# Patient Record
Sex: Male | Born: 1970 | Race: White | Hispanic: No | Marital: Married | Smoking: Former smoker
Health system: Southern US, Community
[De-identification: ages and names within clinical notes are randomized; demographics above are authoritative.]

## PROBLEM LIST (undated history)

## (undated) ENCOUNTER — Inpatient Hospital Stay: Admission: EM | Payer: Self-pay | Source: Home / Self Care

## (undated) DIAGNOSIS — Z923 Personal history of irradiation: Secondary | ICD-10-CM

## (undated) DIAGNOSIS — Z9221 Personal history of antineoplastic chemotherapy: Secondary | ICD-10-CM

## (undated) DIAGNOSIS — F419 Anxiety disorder, unspecified: Secondary | ICD-10-CM

## (undated) DIAGNOSIS — C801 Malignant (primary) neoplasm, unspecified: Secondary | ICD-10-CM

## (undated) DIAGNOSIS — F319 Bipolar disorder, unspecified: Secondary | ICD-10-CM

## (undated) DIAGNOSIS — C099 Malignant neoplasm of tonsil, unspecified: Secondary | ICD-10-CM

## (undated) HISTORY — DX: Personal history of irradiation: Z92.3

## (undated) HISTORY — DX: Malignant neoplasm of tonsil, unspecified: C09.9

## (undated) HISTORY — DX: Anxiety disorder, unspecified: F41.9

## (undated) HISTORY — DX: Bipolar disorder, unspecified: F31.9

## (undated) HISTORY — DX: Personal history of antineoplastic chemotherapy: Z92.21

---

## 1999-11-15 ENCOUNTER — Ambulatory Visit (HOSPITAL_COMMUNITY): Admission: RE | Admit: 1999-11-15 | Discharge: 1999-11-15 | Payer: Self-pay | Admitting: Urology

## 1999-11-15 ENCOUNTER — Encounter (INDEPENDENT_AMBULATORY_CARE_PROVIDER_SITE_OTHER): Payer: Self-pay

## 2006-07-18 HISTORY — PX: KNEE SURGERY: SHX244

## 2007-01-28 ENCOUNTER — Encounter: Admission: RE | Admit: 2007-01-28 | Discharge: 2007-01-28 | Payer: Self-pay | Admitting: Family Medicine

## 2007-11-15 HISTORY — PX: OTHER SURGICAL HISTORY: SHX169

## 2008-05-27 ENCOUNTER — Other Ambulatory Visit: Admission: RE | Admit: 2008-05-27 | Discharge: 2008-05-27 | Payer: Self-pay | Admitting: Otolaryngology

## 2008-06-03 ENCOUNTER — Encounter: Admission: RE | Admit: 2008-06-03 | Discharge: 2008-06-03 | Payer: Self-pay | Admitting: Otolaryngology

## 2008-07-04 DIAGNOSIS — C801 Malignant (primary) neoplasm, unspecified: Secondary | ICD-10-CM

## 2008-07-04 HISTORY — DX: Malignant (primary) neoplasm, unspecified: C80.1

## 2008-07-08 ENCOUNTER — Ambulatory Visit (HOSPITAL_COMMUNITY): Admission: RE | Admit: 2008-07-08 | Discharge: 2008-07-08 | Payer: Self-pay | Admitting: Otolaryngology

## 2008-07-09 ENCOUNTER — Ambulatory Visit: Admission: RE | Admit: 2008-07-09 | Discharge: 2008-10-07 | Payer: Self-pay | Admitting: Radiation Oncology

## 2008-07-16 ENCOUNTER — Ambulatory Visit: Payer: Self-pay | Admitting: Internal Medicine

## 2008-07-18 DIAGNOSIS — Z9221 Personal history of antineoplastic chemotherapy: Secondary | ICD-10-CM

## 2008-07-18 HISTORY — DX: Personal history of antineoplastic chemotherapy: Z92.21

## 2008-07-22 ENCOUNTER — Ambulatory Visit: Payer: Self-pay | Admitting: Dentistry

## 2008-07-22 ENCOUNTER — Encounter: Admission: AD | Admit: 2008-07-22 | Discharge: 2008-07-22 | Payer: Self-pay | Admitting: Dentistry

## 2008-07-23 ENCOUNTER — Encounter (INDEPENDENT_AMBULATORY_CARE_PROVIDER_SITE_OTHER): Payer: Self-pay | Admitting: Otolaryngology

## 2008-07-23 ENCOUNTER — Ambulatory Visit (HOSPITAL_COMMUNITY): Admission: RE | Admit: 2008-07-23 | Discharge: 2008-07-23 | Payer: Self-pay | Admitting: Otolaryngology

## 2008-07-28 LAB — CBC WITH DIFFERENTIAL/PLATELET
BASO%: 0.2 % (ref 0.0–2.0)
Basophils Absolute: 0 10*3/uL (ref 0.0–0.1)
EOS%: 0.6 % (ref 0.0–7.0)
MCH: 30 pg (ref 28.0–33.4)
MCHC: 34.3 g/dL (ref 32.0–35.9)
MCV: 87.2 fL (ref 81.6–98.0)
MONO%: 6.5 % (ref 0.0–13.0)
NEUT%: 69.6 % (ref 40.0–75.0)
RDW: 13.3 % (ref 11.2–14.6)
lymph#: 1.7 10*3/uL (ref 0.9–3.3)

## 2008-07-28 LAB — COMPREHENSIVE METABOLIC PANEL
ALT: 16 U/L (ref 0–53)
AST: 14 U/L (ref 0–37)
Alkaline Phosphatase: 70 U/L (ref 39–117)
BUN: 14 mg/dL (ref 6–23)
Calcium: 10.1 mg/dL (ref 8.4–10.5)
Chloride: 105 mEq/L (ref 96–112)
Creatinine, Ser: 1.03 mg/dL (ref 0.40–1.50)
Potassium: 4.9 mEq/L (ref 3.5–5.3)

## 2008-08-01 ENCOUNTER — Ambulatory Visit (HOSPITAL_COMMUNITY): Admission: RE | Admit: 2008-08-01 | Discharge: 2008-08-01 | Payer: Self-pay | Admitting: Radiation Oncology

## 2008-08-18 LAB — COMPREHENSIVE METABOLIC PANEL
ALT: 27 U/L (ref 0–53)
CO2: 27 mEq/L (ref 19–32)
Creatinine, Ser: 0.8 mg/dL (ref 0.40–1.50)
Glucose, Bld: 131 mg/dL — ABNORMAL HIGH (ref 70–99)
Total Bilirubin: 0.6 mg/dL (ref 0.3–1.2)

## 2008-08-18 LAB — CBC WITH DIFFERENTIAL/PLATELET
BASO%: 0.2 % (ref 0.0–2.0)
HCT: 38.8 % (ref 38.7–49.9)
LYMPH%: 17.5 % (ref 14.0–48.0)
MCHC: 34.6 g/dL (ref 32.0–35.9)
MCV: 86 fL (ref 81.6–98.0)
MONO#: 0.5 10*3/uL (ref 0.1–0.9)
MONO%: 5.4 % (ref 0.0–13.0)
NEUT%: 75.5 % — ABNORMAL HIGH (ref 40.0–75.0)
Platelets: 336 10*3/uL (ref 145–400)
WBC: 8.9 10*3/uL (ref 4.0–10.0)

## 2008-08-18 LAB — MAGNESIUM: Magnesium: 2.7 mg/dL — ABNORMAL HIGH (ref 1.5–2.5)

## 2008-08-25 LAB — COMPREHENSIVE METABOLIC PANEL
ALT: 14 U/L (ref 0–53)
BUN: 14 mg/dL (ref 6–23)
CO2: 21 mEq/L (ref 19–32)
Calcium: 9.2 mg/dL (ref 8.4–10.5)
Chloride: 110 mEq/L (ref 96–112)
Creatinine, Ser: 0.76 mg/dL (ref 0.40–1.50)

## 2008-08-25 LAB — CBC WITH DIFFERENTIAL/PLATELET
Basophils Absolute: 0 10*3/uL (ref 0.0–0.1)
Eosinophils Absolute: 0.1 10*3/uL (ref 0.0–0.5)
HCT: 35.2 % — ABNORMAL LOW (ref 38.7–49.9)
HGB: 12.1 g/dL — ABNORMAL LOW (ref 13.0–17.1)
MONO#: 0.4 10*3/uL (ref 0.1–0.9)
NEUT%: 76.5 % — ABNORMAL HIGH (ref 40.0–75.0)
WBC: 7.4 10*3/uL (ref 4.0–10.0)
lymph#: 1.2 10*3/uL (ref 0.9–3.3)

## 2008-09-01 ENCOUNTER — Ambulatory Visit: Payer: Self-pay | Admitting: Internal Medicine

## 2008-09-01 LAB — CBC WITH DIFFERENTIAL/PLATELET
BASO%: 0.1 % (ref 0.0–2.0)
Eosinophils Absolute: 0 10*3/uL (ref 0.0–0.5)
MCV: 86.1 fL (ref 81.6–98.0)
MONO#: 0.2 10*3/uL (ref 0.1–0.9)
MONO%: 4 % (ref 0.0–13.0)
NEUT#: 5.2 10*3/uL (ref 1.5–6.5)
RBC: 3.67 10*6/uL — ABNORMAL LOW (ref 4.20–5.71)
RDW: 13.2 % (ref 11.2–14.6)
WBC: 6.1 10*3/uL (ref 4.0–10.0)

## 2008-09-01 LAB — COMPREHENSIVE METABOLIC PANEL
ALT: 16 U/L (ref 0–53)
Albumin: 4.2 g/dL (ref 3.5–5.2)
Alkaline Phosphatase: 75 U/L (ref 39–117)
Glucose, Bld: 87 mg/dL (ref 70–99)
Potassium: 4.3 mEq/L (ref 3.5–5.3)
Sodium: 142 mEq/L (ref 135–145)
Total Protein: 6.3 g/dL (ref 6.0–8.3)

## 2008-09-08 LAB — CBC WITH DIFFERENTIAL/PLATELET
BASO%: 0 % (ref 0.0–2.0)
EOS%: 0.2 % (ref 0.0–7.0)
LYMPH%: 5.1 % — ABNORMAL LOW (ref 14.0–49.0)
MCH: 29.7 pg (ref 27.2–33.4)
MCHC: 34.7 g/dL (ref 32.0–36.0)
MCV: 85.5 fL (ref 79.3–98.0)
MONO#: 0.5 10*3/uL (ref 0.1–0.9)
MONO%: 8.5 % (ref 0.0–14.0)
Platelets: 208 10*3/uL (ref 140–400)
RBC: 3.42 10*6/uL — ABNORMAL LOW (ref 4.20–5.82)
WBC: 6.1 10*3/uL (ref 4.0–10.3)

## 2008-09-08 LAB — COMPREHENSIVE METABOLIC PANEL
ALT: 13 U/L (ref 0–53)
AST: 10 U/L (ref 0–37)
Albumin: 4.2 g/dL (ref 3.5–5.2)
CO2: 27 mEq/L (ref 19–32)
Calcium: 9.2 mg/dL (ref 8.4–10.5)
Chloride: 99 mEq/L (ref 96–112)
Potassium: 4.4 mEq/L (ref 3.5–5.3)
Sodium: 137 mEq/L (ref 135–145)
Total Protein: 6.6 g/dL (ref 6.0–8.3)

## 2008-09-08 LAB — MAGNESIUM: Magnesium: 2.2 mg/dL (ref 1.5–2.5)

## 2008-09-15 LAB — CBC WITH DIFFERENTIAL/PLATELET
BASO%: 0.2 % (ref 0.0–2.0)
EOS%: 0.2 % (ref 0.0–7.0)
MCH: 28.7 pg (ref 27.2–33.4)
MCHC: 33.2 g/dL (ref 32.0–36.0)
MCV: 86.3 fL (ref 79.3–98.0)
MONO%: 10 % (ref 0.0–14.0)
RBC: 3.28 10*6/uL — ABNORMAL LOW (ref 4.20–5.82)
RDW: 13.5 % (ref 11.0–14.6)
lymph#: 0.3 10*3/uL — ABNORMAL LOW (ref 0.9–3.3)

## 2008-09-16 ENCOUNTER — Ambulatory Visit: Payer: Self-pay | Admitting: Dentistry

## 2008-09-22 LAB — COMPREHENSIVE METABOLIC PANEL
Albumin: 4 g/dL (ref 3.5–5.2)
Alkaline Phosphatase: 83 U/L (ref 39–117)
Chloride: 102 mEq/L (ref 96–112)
Glucose, Bld: 134 mg/dL — ABNORMAL HIGH (ref 70–99)
Potassium: 4.1 mEq/L (ref 3.5–5.3)
Sodium: 139 mEq/L (ref 135–145)
Total Protein: 6 g/dL (ref 6.0–8.3)

## 2008-09-22 LAB — CBC WITH DIFFERENTIAL/PLATELET
BASO%: 0.5 % (ref 0.0–2.0)
LYMPH%: 7.5 % — ABNORMAL LOW (ref 14.0–49.0)
MCHC: 33.1 g/dL (ref 32.0–36.0)
MCV: 87 fL (ref 79.3–98.0)
MONO#: 0.3 10*3/uL (ref 0.1–0.9)
MONO%: 8.2 % (ref 0.0–14.0)
Platelets: 200 10*3/uL (ref 140–400)
RBC: 3.47 10*6/uL — ABNORMAL LOW (ref 4.20–5.82)
WBC: 3.9 10*3/uL — ABNORMAL LOW (ref 4.0–10.3)
nRBC: 0 % (ref 0–0)

## 2008-09-29 LAB — CBC WITH DIFFERENTIAL/PLATELET
BASO%: 0.3 % (ref 0.0–2.0)
EOS%: 0.3 % (ref 0.0–7.0)
MCH: 28.7 pg (ref 27.2–33.4)
MCHC: 32.6 g/dL (ref 32.0–36.0)
RBC: 3.14 10*6/uL — ABNORMAL LOW (ref 4.20–5.82)
RDW: 14.7 % — ABNORMAL HIGH (ref 11.0–14.6)
lymph#: 0.3 10*3/uL — ABNORMAL LOW (ref 0.9–3.3)

## 2008-09-29 LAB — COMPREHENSIVE METABOLIC PANEL
AST: 8 U/L (ref 0–37)
Albumin: 3.9 g/dL (ref 3.5–5.2)
Alkaline Phosphatase: 79 U/L (ref 39–117)
BUN: 20 mg/dL (ref 6–23)
CO2: 26 mEq/L (ref 19–32)
Glucose, Bld: 121 mg/dL — ABNORMAL HIGH (ref 70–99)
Potassium: 4.3 mEq/L (ref 3.5–5.3)
Sodium: 138 mEq/L (ref 135–145)
Total Protein: 5.9 g/dL — ABNORMAL LOW (ref 6.0–8.3)

## 2008-10-08 ENCOUNTER — Ambulatory Visit: Admission: RE | Admit: 2008-10-08 | Discharge: 2008-11-07 | Payer: Self-pay | Admitting: Radiation Oncology

## 2008-11-10 ENCOUNTER — Ambulatory Visit: Payer: Self-pay | Admitting: Dentistry

## 2008-11-12 ENCOUNTER — Ambulatory Visit (HOSPITAL_COMMUNITY): Admission: RE | Admit: 2008-11-12 | Discharge: 2008-11-12 | Payer: Self-pay | Admitting: Radiation Oncology

## 2008-12-02 ENCOUNTER — Ambulatory Visit (HOSPITAL_COMMUNITY): Admission: RE | Admit: 2008-12-02 | Discharge: 2008-12-02 | Payer: Self-pay | Admitting: Radiation Oncology

## 2009-01-12 ENCOUNTER — Ambulatory Visit (HOSPITAL_COMMUNITY): Admission: RE | Admit: 2009-01-12 | Discharge: 2009-01-12 | Payer: Self-pay | Admitting: Internal Medicine

## 2009-01-13 ENCOUNTER — Ambulatory Visit: Payer: Self-pay | Admitting: Internal Medicine

## 2009-01-15 LAB — COMPREHENSIVE METABOLIC PANEL
ALT: 9 U/L (ref 0–53)
AST: 9 U/L (ref 0–37)
Albumin: 4.8 g/dL (ref 3.5–5.2)
BUN: 10 mg/dL (ref 6–23)
Calcium: 10 mg/dL (ref 8.4–10.5)
Chloride: 106 mEq/L (ref 96–112)
Potassium: 4.5 mEq/L (ref 3.5–5.3)
Sodium: 140 mEq/L (ref 135–145)
Total Protein: 6.9 g/dL (ref 6.0–8.3)

## 2009-01-15 LAB — CBC WITH DIFFERENTIAL/PLATELET
Basophils Absolute: 0 10*3/uL (ref 0.0–0.1)
EOS%: 1.7 % (ref 0.0–7.0)
HGB: 14.1 g/dL (ref 13.0–17.1)
MCH: 31.4 pg (ref 27.2–33.4)
NEUT#: 1.9 10*3/uL (ref 1.5–6.5)
RBC: 4.49 10*6/uL (ref 4.20–5.82)
RDW: 12.4 % (ref 11.0–14.6)
lymph#: 0.4 10*3/uL — ABNORMAL LOW (ref 0.9–3.3)

## 2009-04-17 ENCOUNTER — Ambulatory Visit: Payer: Self-pay | Admitting: Internal Medicine

## 2009-04-28 ENCOUNTER — Ambulatory Visit (HOSPITAL_COMMUNITY): Admission: RE | Admit: 2009-04-28 | Discharge: 2009-04-28 | Payer: Self-pay | Admitting: Internal Medicine

## 2009-10-30 ENCOUNTER — Ambulatory Visit: Payer: Self-pay | Admitting: Internal Medicine

## 2009-11-03 ENCOUNTER — Ambulatory Visit (HOSPITAL_COMMUNITY): Admission: RE | Admit: 2009-11-03 | Discharge: 2009-11-03 | Payer: Self-pay | Admitting: Internal Medicine

## 2009-11-06 LAB — CBC WITH DIFFERENTIAL/PLATELET
Basophils Absolute: 0 10*3/uL (ref 0.0–0.1)
Eosinophils Absolute: 0.1 10*3/uL (ref 0.0–0.5)
HGB: 12.7 g/dL — ABNORMAL LOW (ref 13.0–17.1)
MCV: 89.1 fL (ref 79.3–98.0)
MONO#: 0.4 10*3/uL (ref 0.1–0.9)
MONO%: 10.6 % (ref 0.0–14.0)
NEUT#: 3.1 10*3/uL (ref 1.5–6.5)
RBC: 4.2 10*6/uL (ref 4.20–5.82)
RDW: 12.7 % (ref 11.0–14.6)
WBC: 4 10*3/uL (ref 4.0–10.3)
lymph#: 0.4 10*3/uL — ABNORMAL LOW (ref 0.9–3.3)

## 2009-11-06 LAB — COMPREHENSIVE METABOLIC PANEL
Albumin: 4.4 g/dL (ref 3.5–5.2)
Alkaline Phosphatase: 72 U/L (ref 39–117)
Calcium: 9.4 mg/dL (ref 8.4–10.5)
Chloride: 105 mEq/L (ref 96–112)
Glucose, Bld: 102 mg/dL — ABNORMAL HIGH (ref 70–99)
Potassium: 4 mEq/L (ref 3.5–5.3)
Sodium: 143 mEq/L (ref 135–145)
Total Protein: 6.3 g/dL (ref 6.0–8.3)

## 2010-05-04 ENCOUNTER — Ambulatory Visit: Payer: Self-pay | Admitting: Internal Medicine

## 2010-05-05 ENCOUNTER — Ambulatory Visit (HOSPITAL_COMMUNITY): Admission: RE | Admit: 2010-05-05 | Discharge: 2010-05-05 | Payer: Self-pay | Admitting: Internal Medicine

## 2010-05-05 LAB — CBC WITH DIFFERENTIAL/PLATELET
Basophils Absolute: 0 10*3/uL (ref 0.0–0.1)
Eosinophils Absolute: 0.1 10*3/uL (ref 0.0–0.5)
HGB: 13.5 g/dL (ref 13.0–17.1)
MCV: 87.2 fL (ref 79.3–98.0)
MONO#: 0.3 10*3/uL (ref 0.1–0.9)
MONO%: 12.4 % (ref 0.0–14.0)
NEUT#: 1.8 10*3/uL (ref 1.5–6.5)
Platelets: 184 10*3/uL (ref 140–400)
RBC: 4.38 10*6/uL (ref 4.20–5.82)
RDW: 12.5 % (ref 11.0–14.6)
WBC: 2.6 10*3/uL — ABNORMAL LOW (ref 4.0–10.3)

## 2010-05-05 LAB — COMPREHENSIVE METABOLIC PANEL
Albumin: 4.3 g/dL (ref 3.5–5.2)
BUN: 14 mg/dL (ref 6–23)
CO2: 25 mEq/L (ref 19–32)
Calcium: 9.2 mg/dL (ref 8.4–10.5)
Glucose, Bld: 92 mg/dL (ref 70–99)
Potassium: 4.2 mEq/L (ref 3.5–5.3)
Sodium: 134 mEq/L — ABNORMAL LOW (ref 135–145)
Total Protein: 6 g/dL (ref 6.0–8.3)

## 2010-08-06 ENCOUNTER — Other Ambulatory Visit: Payer: Self-pay | Admitting: Internal Medicine

## 2010-08-06 DIAGNOSIS — C801 Malignant (primary) neoplasm, unspecified: Secondary | ICD-10-CM

## 2010-08-08 ENCOUNTER — Encounter: Payer: Self-pay | Admitting: Internal Medicine

## 2010-08-12 LAB — CBC WITH DIFFERENTIAL/PLATELET
BASO%: 0.5 % (ref 0.0–2.0)
Basophils Absolute: 0 10*3/uL (ref 0.0–0.1)
EOS%: 2.7 % (ref 0.0–7.0)
HCT: 39.4 % (ref 38.4–49.9)
HGB: 13.5 g/dL (ref 13.0–17.1)
MONO#: 0.4 10*3/uL (ref 0.1–0.9)
NEUT%: 72.8 % (ref 39.0–75.0)
RDW: 12.7 % (ref 11.0–14.6)
WBC: 4.4 10*3/uL (ref 4.0–10.3)
lymph#: 0.7 10*3/uL — ABNORMAL LOW (ref 0.9–3.3)

## 2010-10-05 LAB — GLUCOSE, CAPILLARY: Glucose-Capillary: 94 mg/dL (ref 70–99)

## 2010-11-01 LAB — BASIC METABOLIC PANEL
CO2: 26 mEq/L (ref 19–32)
Calcium: 9.4 mg/dL (ref 8.4–10.5)
Creatinine, Ser: 0.94 mg/dL (ref 0.4–1.5)
GFR calc Af Amer: >60 mL/min (ref >60)
Glucose, Bld: 104 mg/dL — ABNORMAL HIGH (ref 70–99)

## 2010-11-01 LAB — CBC
Hemoglobin: 11.7 g/dL — ABNORMAL LOW (ref 13.0–17.0)
MCHC: 33.5 g/dL (ref 30.0–36.0)
MCHC: 33.8 g/dL (ref 30.0–36.0)
MCV: 88.2 fL (ref 78.0–100.0)
RBC: 3.94 MIL/uL — ABNORMAL LOW (ref 4.22–5.81)
RDW: 13.2 % (ref 11.5–15.5)
RDW: 12.8 % (ref 11.5–15.5)

## 2010-11-12 ENCOUNTER — Other Ambulatory Visit (HOSPITAL_COMMUNITY): Payer: Self-pay

## 2010-11-17 ENCOUNTER — Inpatient Hospital Stay (HOSPITAL_COMMUNITY): Admission: RE | Admit: 2010-11-17 | Payer: Self-pay | Source: Ambulatory Visit

## 2010-11-18 ENCOUNTER — Other Ambulatory Visit: Payer: Self-pay | Admitting: Internal Medicine

## 2010-11-18 ENCOUNTER — Encounter (HOSPITAL_COMMUNITY): Payer: Self-pay

## 2010-11-18 ENCOUNTER — Ambulatory Visit (HOSPITAL_COMMUNITY)
Admission: RE | Admit: 2010-11-18 | Discharge: 2010-11-18 | Disposition: A | Discharge status: Home / Self Care | Payer: BC Managed Care – PPO | Source: Ambulatory Visit | Attending: Internal Medicine | Admitting: Internal Medicine

## 2010-11-18 DIAGNOSIS — C801 Malignant (primary) neoplasm, unspecified: Secondary | ICD-10-CM

## 2010-11-18 DIAGNOSIS — E278 Other specified disorders of adrenal gland: Secondary | ICD-10-CM | POA: Insufficient documentation

## 2010-11-18 DIAGNOSIS — C119 Malignant neoplasm of nasopharynx, unspecified: Secondary | ICD-10-CM | POA: Insufficient documentation

## 2010-11-18 DIAGNOSIS — J329 Chronic sinusitis, unspecified: Secondary | ICD-10-CM | POA: Insufficient documentation

## 2010-11-18 HISTORY — DX: Malignant (primary) neoplasm, unspecified: C80.1

## 2010-11-18 MED ORDER — IOHEXOL 300 MG/ML  SOLN
100.0000 mL | Freq: Once | INTRAMUSCULAR | Status: AC | PRN
  Administered 2010-11-18: 100 mL via INTRAVENOUS

## 2010-11-30 ENCOUNTER — Encounter (HOSPITAL_BASED_OUTPATIENT_CLINIC_OR_DEPARTMENT_OTHER): Payer: BC Managed Care – PPO | Admitting: Internal Medicine

## 2010-11-30 ENCOUNTER — Other Ambulatory Visit: Payer: Self-pay | Admitting: Internal Medicine

## 2010-11-30 DIAGNOSIS — C119 Malignant neoplasm of nasopharynx, unspecified: Secondary | ICD-10-CM

## 2010-11-30 DIAGNOSIS — C111 Malignant neoplasm of posterior wall of nasopharynx: Secondary | ICD-10-CM

## 2010-11-30 LAB — COMPREHENSIVE METABOLIC PANEL
ALT: 15 U/L (ref 0–53)
BUN: 15 mg/dL (ref 6–23)
CO2: 28 mEq/L (ref 19–32)
Calcium: 9.4 mg/dL (ref 8.4–10.5)
Chloride: 104 mEq/L (ref 96–112)
Creatinine, Ser: 0.95 mg/dL (ref 0.40–1.50)

## 2010-11-30 LAB — CBC WITH DIFFERENTIAL/PLATELET
Eosinophils Absolute: 0.1 10*3/uL (ref 0.0–0.5)
MONO#: 0.4 10*3/uL (ref 0.1–0.9)
NEUT#: 2.8 10*3/uL (ref 1.5–6.5)
RBC: 4.57 10*6/uL (ref 4.20–5.82)
RDW: 13.3 % (ref 11.0–14.6)
WBC: 3.9 10*3/uL — ABNORMAL LOW (ref 4.0–10.3)
lymph#: 0.6 10*3/uL — ABNORMAL LOW (ref 0.9–3.3)

## 2010-11-30 LAB — LACTATE DEHYDROGENASE: LDH: 169 U/L (ref 94–250)

## 2010-11-30 NOTE — Op Note (Signed)
NAMEANMOL, FLECK            ACCOUNT NO.:  [id number]   MEDICAL RECORD NO.:  [id number]          PATIENT TYPE:  AMB   LOCATION:  SDS                          FACILITY:  [facility]   PHYSICIAN:  Jefry H. 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   POSTOPERATIVE DIAGNOSIS:  Squamous cell carcinoma of nasal cavity.   SURGEON:  Jefry H. Pollyann Kennedy, MD   General endotracheal anesthesia was used.   PROCEDURE:  Nasal endoscopy with biopsy of nasal mass.   FINDINGS:  Exophytic mass involving bilateral posterior nasal cavities  more so on the left, that seemed to be arising from the floor of the  nose and the septum.  Severe deviation of the nasal septum prevents easy  access and visibility of both sides of the tumor.  There is some  fullness of the hard and soft palate junction without any erosion or  obvious mass palpable.   HISTORY:  A 40 year old presented with bilateral upper jugular  adenopathy.  Biopsy reveals squamous cell carcinoma.  PET scan revealed  possible posterior nasal mass.  I was unable to perform nasal endoscopy  in the office due to the severe deviation in the septum and difficulty  approaching or entering the posterior nasal cavities bilaterally.  Risks, benefits, alternatives, and complications of the procedure were  explained to the patient, seem to understand and agreed to the surgery.   PROCEDURE:  The patient was taken to the operating room, placed on the  operating room table in supine position.  Following induction of general  endotracheal anesthesia, the patient was prepped and draped in the  standard fashion.  Afrin spray was used preoperatively in the nasal  cavities.  Afrin pledgets were used bilaterally.  A 1% Xylocaine was  infiltrated  into the septum, the inferior turbinates and the floor of  the nose anterior to where the tumor was visualized.  A zero-degree  nasal endoscope was used.  Suction was used to evacuate mucus and some  minor bleeding.  The lesion was identified with some difficulty and  multiple fragments were taken with a Takahashi forceps and sent for  frozen section for evaluation.  Frozen section consistent with high-  grade squamous cell carcinoma.  Afrin pledgets were used for hemostasis.  The left nasal cavity was packed with Gelfoam soaked in Afrin.  The  patient was then awakened from anesthesia, extubated, and transferred to  recovery in stable condition.      Jefry H. Pollyann Kennedy, MD  Electronically Signed     JHR/MEDQ  D:  07/23/2008  T:  07/24/2008  Job:  [id number]

## 2010-11-30 NOTE — Consult Note (Signed)
NAMEJEOFFREY, ELEAZER NO.:  [id number]   MEDICAL RECORD NO.:  [id number]          PATIENT TYPE:  AMB   LOCATION:  SDS                          FACILITY:  [facility]   PHYSICIAN:  Charlynne Pander, D.D.S.DATE OF BIRTH:  1970-10-05   DATE OF CONSULTATION:  07/22/2008  DATE OF DISCHARGE:  07/23/2008                                 CONSULTATION   HISTORY OF PRESENT ILLNESS:  Michael Dunn is a 40 year old male  referred by Dr. Lurline Hare for a dental consultation.  The patient  is known to have probable nasopharyngeal carcinoma with anticipated  chemoradiation therapy.  The patient is now seen as part of pre-  chemoradiation therapy dental protocol evaluation.  1. 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      a.     Status post excision of biopsy of the right neck node on       July 04, 2008 by Dr. Serena Colonel.  This was positive for    squamous cell carcinoma.  The patient with anticipated direct  laryngoscopy and multiple biopsies is currently scheduled    for July 23, 2008 for definitive diagnosis of the presence of  nasopharyngeal squamous cell carcinoma.    B.  Anticipated radiation therapy with Dr. Michell Heinrich.    C.  Anticipated chemotherapy with Dr. Arbutus Ped with consultation  pending.  1. Bipolar disorder-II, currently on Lamictal and Risperdal.  The      patient is followed by Dr. Tomasa Rand.  2. Status post right arthroscopic knee surgery in 2008.  3. Status post right testicular epididymal mass excision on November 15, 1999 by Dr. Isabel Caprice.   ALLERGIES:  None known.   MEDICATIONS:  1. Lamictal 200 mg at bedtime.  2. Risperdal 3 mg 1/2 tablet at bedtime.  3. Xanax 0.25 mg up to 3 times a day as needed.   SOCIAL HISTORY:  The patient is married with 2 children.  The patient is  a Investment banker, operational. The patient is married to [place].  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  The patient with a history of smoking 1-  pack  per day for 18 years.  The patient did quit approximately 6 weeks  ago.  The patient with occasional alcoholic beverage beer, wine as  indicated.   FAMILY HISTORY:  The patient was adopted.  Natural mother is alive and  healthy per his knowledge.  Father is also alive and healthy as far as  he knows.   FUNCTIONAL ASSESSMENT:  The patient remains independent for ADLs at this  time.   REVIEW OF SYSTEMS:  This is reviewed with the patient and is included in  the dental consultation record.   DENTAL HISTORY:   CHIEF COMPLAINT:  The patient is a pre-chemoradiation therapy dental  evaluation.   HISTORY OF PRESENT ILLNESS:  The patient with squamous cell carcinoma  involving the right neck node.  The patient with probable nasopharyngeal  carcinoma with current direct laryngoscopy and biopsy scheduled for  July 23, 2008 with Dr. Pollyann Kennedy.  The patient with anticipated  chemoradiation therapy  in the future.  The patient is now seen as part  of a pre-chemoradiation therapy dental protocol evaluation.   The patient currently denies acute toothache, swellings, or abscesses.  The patient was last seen in approximately November 2009 for an exam and  cleaning.  The patient is usually seen on every 77-month basis.  The  patient sees Dr. Irven Easterly for his dental treatment.  The patient  denies having any unmet dental needs at this time.  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 The  patient does have a history of orthodontic therapy as a teenager.   DENTAL EXAMINATION:  GENERAL:  The patient is well-developed, well-  nourished male in no acute distress.  VITAL SIGNS:  Blood pressure is 114/69, pulse rate is 64, and  temperature is 97.0.  HEAD AND NECK:  The patient with significant right and left neck  lymphadenopathy measuring approximately 3 cm each.  The patient  currently denies acute TMJ symptoms.  INTRAORAL:  The patient with incipient  xerostomia.  The patient with an  apically positioned maxillary labial frenum.  The patient does appear to  have a 1-cm soft tissue lesion just left of midline at the junction of  the hard palate and soft palate area.  This may represent an area of the  cancer extension into the oral cavity.  The area is slightly raised,  approximately 1-2 mm and is fluctuant in nature.  The patient has a  mandibular left lingual torus which is small.  There is no evidence of  abscess formation within the mouth at this time.  DENTITION:  The patient is missing tooth #1 which was never extracted  and was congenitally missing.  PERIODONTAL:  The patient with chronic periodontitis with minimal plaque  accumulations.  The patient with generalized good oral hygiene.  The  patient with some increased pocket depths associated with the mandibular  molars on the lingual aspects.  Please see dental charting form.  DENTAL CARIES:  There are no obvious dental caries noted, although the  patient may have possible incipient caries on the mesial of tooth #18.  We will defer this to Dr. Jari Favre to determine if a dental restoration  is needed at this time.  ENDODONTIC:  The patient currently denies acute pulpitis symptoms.  There is no evidence of periapical pathology at this time.  CROWN OR BRIDGE:  There are no crown or bridge restorations.  PROSTHODONTIC:  There is no indication for dentures.  OCCLUSION:  The patient has a stable occlusion at this time and status  post orthodontic therapy as a teenager.   RADIOGRAPHIC INTERPRETATION:  A panoramic x-ray was taken and  supplemented with a full series of dental radiographs.   There is missing tooth #1.  There is no evidence of periapical  pathology.  The maxillary sinuses appear to be somewhat occluded.  This  may represent extension of a cancer in this area.  There are several  dental restorations noted.   ASSESSMENT:  1. Minimal plaque and calculus  accumulations.  2. Chronic periodontitis with incipient bone loss.  3. Missing tooth #1.  4. Questionable medial caries on tooth #18.  We will defer this to Dr.      Jari Favre to determine need for dental restoration at this time.  5. Status post orthodontic therapy with a stable occlusion.  6. A 1-cm mass in the area of the hard palate at the junction of the  heart and soft palate near the midline but more towards the left      lateral aspect.  7. Small mandibular left torus.  8. Apically positioned labial frenum.  9. Incipient xerostomia.   PLAN/RECOMMENDATIONS:  1. I discussed the risks, benefits, and complications of various      treatment options with the patient in relationship to his medical      and dental conditions and anticipated chemoradiation therapy and      chemoradiation therapy side effects to include xerostomia,      radiation caries, trismus, mucositis, taste changes, gum and jaw      bone changes, risk for infection, bleeding, and osteoradionecrosis.      We discussed various treatment options to include no treatment,      selective extractions of the tooth numbers 16, 17, and 32,      selective extractions at teeth in the primary field of radiation      therapy, alveoloplasty as indicated, periodontal therapy, dental      restorations, root canal therapy, crown or bridge therapy, implant      therapy, and replacing missing teeth as indicated.  We also      discussed fabrication of fluoride trays scatter protection devices.      The patient currently wishes to be referred to Dr. Dutch Quint      (oral and maxillofacial surgeon) for a second opinion concerning      need for extractions at this time.  Most likely, extraction of      tooth numbers 16, 17, and 32 would be recommended at this time due      to the difficulty in maintaining adequate periodontal health and      leaving other teeth in the primary field of radiation therapy.  The      patient will be at  risk for future complication of      osteoradionecrosis and the patient will be advised to see his      regular dentist for routine dental care every 3-4 months as      indicated due to the extent of the xerostomia.  2. The patient also agrees to fabrication of fluoride trays scatter      protection devices as indicated after further discussion with Dr.      Lurline Hare.  3. Provision of written and verbal information on Radiation Therapy      and Your Mouth as well as Chemotherapy and Your Mouth - today.  4. Alginate impressions for the fabrication of fluoride trays today.  5. We will discuss findings at head-neck cancer conference as well as      further discussion with Dr. Pollyann Kennedy, Dr. Lurline Hare, Dr. Dutch Quint, Dr. Si Gaul, and Dr. Irven Easterly as indicated.      Charlynne Pander, D.D.S.  Electronically Signed     RFK/MEDQ  D:  07/23/2008  T:  07/24/2008  Job:  [id number]   cc:   Lurline Hare, MD  Lajuana Matte, MD  Barbera Setters Lemmons, DDS  Lyndal Pulley Chales Salmon, M.D.  Jefry H. Pollyann Kennedy, MD

## 2011-05-24 ENCOUNTER — Other Ambulatory Visit (HOSPITAL_BASED_OUTPATIENT_CLINIC_OR_DEPARTMENT_OTHER): Payer: BC Managed Care – PPO | Admitting: Lab

## 2011-05-24 ENCOUNTER — Other Ambulatory Visit: Payer: Self-pay | Admitting: Internal Medicine

## 2011-05-24 DIAGNOSIS — C111 Malignant neoplasm of posterior wall of nasopharynx: Secondary | ICD-10-CM

## 2011-05-24 LAB — CBC WITH DIFFERENTIAL/PLATELET
BASO%: 0.5 % (ref 0.0–2.0)
Eosinophils Absolute: 0.1 10*3/uL (ref 0.0–0.5)
LYMPH%: 12.9 % — ABNORMAL LOW (ref 14.0–49.0)
MCHC: 34.1 g/dL (ref 32.0–36.0)
MCV: 88.6 fL (ref 79.3–98.0)
MONO%: 7.3 % (ref 0.0–14.0)
NEUT%: 78.2 % — ABNORMAL HIGH (ref 39.0–75.0)
Platelets: 221 10*3/uL (ref 140–400)
RBC: 4.75 10*6/uL (ref 4.20–5.82)

## 2011-05-24 LAB — COMPREHENSIVE METABOLIC PANEL
Alkaline Phosphatase: 68 U/L (ref 39–117)
Creatinine, Ser: 0.91 mg/dL (ref 0.50–1.35)
Glucose, Bld: 86 mg/dL (ref 70–99)
Sodium: 139 mEq/L (ref 135–145)
Total Bilirubin: 0.3 mg/dL (ref 0.3–1.2)
Total Protein: 7.7 g/dL (ref 6.0–8.3)

## 2011-05-25 ENCOUNTER — Other Ambulatory Visit (HOSPITAL_COMMUNITY): Payer: BC Managed Care – PPO

## 2011-05-25 ENCOUNTER — Ambulatory Visit (HOSPITAL_COMMUNITY)
Admission: RE | Admit: 2011-05-25 | Discharge: 2011-05-25 | Disposition: A | Discharge status: Home / Self Care | Payer: BC Managed Care – PPO | Source: Ambulatory Visit | Attending: Internal Medicine | Admitting: Internal Medicine

## 2011-05-25 DIAGNOSIS — J3489 Other specified disorders of nose and nasal sinuses: Secondary | ICD-10-CM | POA: Insufficient documentation

## 2011-05-25 DIAGNOSIS — Z9221 Personal history of antineoplastic chemotherapy: Secondary | ICD-10-CM | POA: Insufficient documentation

## 2011-05-25 DIAGNOSIS — C119 Malignant neoplasm of nasopharynx, unspecified: Secondary | ICD-10-CM | POA: Insufficient documentation

## 2011-05-25 DIAGNOSIS — Z923 Personal history of irradiation: Secondary | ICD-10-CM | POA: Insufficient documentation

## 2011-05-25 MED ORDER — IOHEXOL 300 MG/ML  SOLN
100.0000 mL | Freq: Once | INTRAMUSCULAR | Status: AC | PRN
  Administered 2011-05-25: 100 mL via INTRAVENOUS

## 2011-05-28 ENCOUNTER — Encounter: Payer: Self-pay | Admitting: *Deleted

## 2011-06-01 ENCOUNTER — Ambulatory Visit (HOSPITAL_BASED_OUTPATIENT_CLINIC_OR_DEPARTMENT_OTHER): Payer: BC Managed Care – PPO | Admitting: Internal Medicine

## 2011-06-01 ENCOUNTER — Telehealth: Payer: Self-pay | Admitting: Internal Medicine

## 2011-06-01 VITALS — BP 139/91 | HR 82 | Temp 97.7°F | Ht 70.0 in | Wt 164.8 lb

## 2011-06-01 DIAGNOSIS — C140 Malignant neoplasm of pharynx, unspecified: Secondary | ICD-10-CM

## 2011-06-01 DIAGNOSIS — C119 Malignant neoplasm of nasopharynx, unspecified: Secondary | ICD-10-CM

## 2011-06-01 DIAGNOSIS — K117 Disturbances of salivary secretion: Secondary | ICD-10-CM

## 2011-06-01 NOTE — Progress Notes (Signed)
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  PRIOR THERAPY: Status post concurrent chemoradiation with weekly cisplatin.  Last dose was given September 29, 2008.  CURRENT THERAPY: Observation.  INTERVAL HISTORY: Michael Dunn 40 y.o. male returns to the clinic today for his six-month followup visit. The patient is feeling fine he denied having any specific complaints. He has no chest pain or shortness of breath, has no cough or hemoptysis. No significant weight loss or night sweats. No difficulty swallowing but he still has decreased salivation. He has repeat CT scan of the neck and chest performed on 05/25/2011 and he is here today for evaluation and discussion of his scan results.   MEDICAL HISTORY: Past Medical History  Diagnosis Date  . Cancer     squamous cell ca of nasal pharynx  . Bipolar disorder     ALLERGIES:   has no known allergies.  MEDICATIONS:  Current Outpatient Prescriptions  Medication Sig Dispense Refill  . Cyanocobalamin (VITAMIN B12 PO) Take 1 tablet by mouth daily.        . Ibuprofen (ADVIL PO) Take by mouth as needed.        . Multiple Vitamin (MULTIVITAMIN) tablet Take 1 tablet by mouth daily.        . Omega-3 Fatty Acids (OMEGA 3 PO) Take 3,000 mg by mouth daily.          SURGICAL HISTORY:  Past Surgical History  Procedure Date  . Knee surgery 2008  . Resection of spermatocele     REVIEW OF SYSTEMS:  A comprehensive review of systems was negative.   PHYSICAL EXAMINATION: General appearance: alert, cooperative and no distress Head: Normocephalic, without obvious abnormality, atraumatic Neck: no adenopathy Lymph nodes: Cervical, supraclavicular, and axillary nodes normal. Resp: clear to auscultation bilaterally Cardio: regular rate and rhythm, S1, S2 normal, no murmur, click, rub or gallop GI: soft, non-tender; bowel sounds normal; no masses,  no organomegaly Extremities:  extremities normal, atraumatic, no cyanosis or edema Neurologic: Alert and oriented X 3, normal strength and tone. Normal symmetric reflexes. Normal coordination and gait  ECOG PERFORMANCE STATUS: 1 - Symptomatic but completely ambulatory  Blood pressure 139/91, pulse 82, temperature 97.7 F (36.5 C), temperature source Oral, height 5\' 10"  (1.778 m), weight 164 lb 12.8 oz (74.753 kg).  LABORATORY DATA: Lab Results  Component Value Date   WBC 4.6 05/24/2011   HGB 14.4 05/24/2011   HCT 42.1 05/24/2011   MCV 88.6 05/24/2011   PLT 221 05/24/2011      Chemistry      Component Value Date/Time   NA 139 05/24/2011 1535   K 4.4 05/24/2011 1535   CL 103 05/24/2011 1535   CO2 27 05/24/2011 1535   BUN 10 05/24/2011 1535   CREATININE 0.91 05/24/2011 1535      Component Value Date/Time   CALCIUM 10.2 05/24/2011 1535   ALKPHOS 68 05/24/2011 1535   AST 23 05/24/2011 1535   ALT 24 05/24/2011 1535   BILITOT 0.3 05/24/2011 1535       RADIOGRAPHIC STUDIES: Ct Soft Tissue Neck W Contrast  05/25/2011  *RADIOLOGY REPORT*  Clinical Data: Nasal pharyngeal cancer status post chemotherapy and radiation.  The patients treatments were completed to 1010.  No current complaints.  CT NECK WITH CONTRAST  Technique:  Multidetector CT imaging of the neck was performed with intravenous contrast.  Contrast: OMNIPAQUE IOHEXOL 300 MG/ML IV SOLN  Comparison: CT neck 11/18/2010.  Findings: Limited imaging of the brain is unremarkable.  The high shock  No focal mucosal or submucosal lesions are present.  The vocal cords are midline and symmetric.  No significant adenopathy is present.  There is some soft tissue about the carotid arteries bilaterally, compatible with prior radiation therapy.  This is stable.  No focal stenosis is evident.  The lung apices are clear.  The bone windows are unremarkable.  A fluid level in the right maxillary sinus is less prominent than on the prior study.  The left maxillary sinus is near totally  opacified.  The ethmoid air cells demonstrate mild mucosal thickening.  Mastoid air cells are clear.  IMPRESSION:  1.  No evidence for residual or recurrent tumor. 2.  Postradiation changes in the neck. 3.  Chronic sinus disease.  Original Report Authenticated By: Jamesetta Orleans. MATTERN, M.D.   Ct Chest W Contrast  05/25/2011  *RADIOLOGY REPORT*  Clinical Data: Nasopharyngeal cancer.  CT CHEST WITH CONTRAST  Technique:  Multidetector CT imaging of the chest was performed following the standard protocol during bolus administration of intravenous contrast.  Contrast: OMNIPAQUE IOHEXOL 300 MG/ML IV SOLN  Comparison: Chest CT from 11/18/2010.  PET CT from 11/03/2009.  Findings: There is no axillary, mediastinal, or hilar lymphadenopathy.  Heart size is normal.  No pericardial or pleural effusion.  The lungs are clear without focal infiltrate, edema, pneumothorax or pleural effusion.  Bone windows reveal no worrisome lytic or sclerotic osseous lesions.  Right adrenal nodule is stable and 2.4 x 2.8 cm.  IMPRESSION: Stable exam.  No new or progressive disease within the chest.  No change in the right adrenal nodule.  Original Report Authenticated By: ERIC A. MANSELL, M.D.    ASSESSMENT: This is a very pleasant 40 years old white male with stage IV nasopharyngeal squama cell carcinoma diagnosed in December of 2009, status post concurrent chemoradiation. The patient has been observation since March of 2000 and with no evidence for disease recurrence. I discussed the scan results with Mr. Michael Dunn.  PLAN: He will continue on observation for now. I would see him back for followup visit in one year with repeat CT scan of the neck and chest with contrast. All questions were answered. The patient knows to call the clinic with any problems, questions or concerns. We can certainly see the patient much sooner if necessary.

## 2012-05-24 ENCOUNTER — Telehealth: Payer: Self-pay | Admitting: Internal Medicine

## 2012-05-24 NOTE — Telephone Encounter (Signed)
pt called and lm to r/s an appt,called him back today and lm for him to ret. my call   aom

## 2012-05-24 NOTE — Telephone Encounter (Signed)
l/m with 3;45 md appt time per pt   aom

## 2012-05-29 ENCOUNTER — Other Ambulatory Visit (HOSPITAL_BASED_OUTPATIENT_CLINIC_OR_DEPARTMENT_OTHER): Payer: BC Managed Care – PPO

## 2012-05-29 ENCOUNTER — Ambulatory Visit (HOSPITAL_COMMUNITY)
Admission: RE | Admit: 2012-05-29 | Discharge: 2012-05-29 | Disposition: A | Discharge status: Home / Self Care | Payer: BC Managed Care – PPO | Source: Ambulatory Visit | Attending: Internal Medicine | Admitting: Internal Medicine

## 2012-05-29 DIAGNOSIS — C140 Malignant neoplasm of pharynx, unspecified: Secondary | ICD-10-CM | POA: Insufficient documentation

## 2012-05-29 DIAGNOSIS — J320 Chronic maxillary sinusitis: Secondary | ICD-10-CM | POA: Insufficient documentation

## 2012-05-29 LAB — CBC WITH DIFFERENTIAL/PLATELET
BASO%: 0.7 % (ref 0.0–2.0)
EOS%: 3 % (ref 0.0–7.0)
HGB: 14.4 g/dL (ref 13.0–17.1)
MCH: 29.6 pg (ref 27.2–33.4)
MCHC: 33.9 g/dL (ref 32.0–36.0)
RDW: 12.7 % (ref 11.0–14.6)
lymph#: 0.5 10*3/uL — ABNORMAL LOW (ref 0.9–3.3)

## 2012-05-29 LAB — COMPREHENSIVE METABOLIC PANEL (CC13)
ALT: 16 U/L (ref 0–55)
AST: 16 U/L (ref 5–34)
Albumin: 4.3 g/dL (ref 3.5–5.0)
Calcium: 10.3 mg/dL (ref 8.4–10.4)
Chloride: 105 mEq/L (ref 98–107)
Potassium: 3.5 mEq/L (ref 3.5–5.1)

## 2012-05-29 MED ORDER — IOHEXOL 300 MG/ML  SOLN
100.0000 mL | Freq: Once | INTRAMUSCULAR | Status: AC | PRN
  Administered 2012-05-29: 100 mL via INTRAVENOUS

## 2012-05-31 ENCOUNTER — Ambulatory Visit: Payer: BC Managed Care – PPO | Admitting: Internal Medicine

## 2012-06-05 ENCOUNTER — Ambulatory Visit (HOSPITAL_BASED_OUTPATIENT_CLINIC_OR_DEPARTMENT_OTHER): Payer: BC Managed Care – PPO | Admitting: Internal Medicine

## 2012-06-05 VITALS — BP 132/76 | HR 66 | Temp 97.4°F | Resp 18 | Ht 70.0 in | Wt 166.0 lb

## 2012-06-05 DIAGNOSIS — C140 Malignant neoplasm of pharynx, unspecified: Secondary | ICD-10-CM

## 2012-06-05 NOTE — Progress Notes (Signed)
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   PRIOR THERAPY: Status post concurrent chemoradiation with weekly cisplatin. Last dose was given September 29, 2008.   CURRENT THERAPY: Observation.  INTERVAL HISTORY: Michael Dunn 41 y.o. male returns to the clinic today for routine annual followup visit. The patient is feeling fine today with no specific complaints. He denied having any significant weight loss or night sweats. He denied having any swallowing difficulty. He still using water at regular basis because of the decreased saliva. The patient denied having any significant chest pain, shortness breath, cough or hemoptysis. He had repeat CT scan of the neck and chest performed recently and he is here today for evaluation and discussion of his scan results.   MEDICAL HISTORY: Past Medical History  Diagnosis Date  . Cancer     squamous cell ca of nasal pharynx  . Bipolar disorder     ALLERGIES:   has no known allergies.  MEDICATIONS:  Current Outpatient Prescriptions  Medication Sig Dispense Refill  . Cyanocobalamin (VITAMIN B12 PO) Take 1 tablet by mouth daily.        . Ibuprofen (ADVIL PO) Take by mouth as needed.        . Multiple Vitamin (MULTIVITAMIN) tablet Take 1 tablet by mouth daily.        . Omega-3 Fatty Acids (OMEGA 3 PO) Take 3,000 mg by mouth daily.          SURGICAL HISTORY:  Past Surgical History  Procedure Date  . Knee surgery 2008  . Resection of spermatocele     REVIEW OF SYSTEMS:  A comprehensive review of systems was negative.   PHYSICAL EXAMINATION: General appearance: alert, cooperative and appears stated age Head: Normocephalic, without obvious abnormality, atraumatic Neck: no adenopathy Resp: clear to auscultation bilaterally Cardio: regular rate and rhythm, S1, S2 normal, no murmur, click, rub or  gallop GI: soft, non-tender; bowel sounds normal; no masses,  no organomegaly Extremities: extremities normal, atraumatic, no cyanosis or edema  ECOG PERFORMANCE STATUS: 0 - Asymptomatic  Blood pressure 132/76, pulse 66, temperature 97.4 F (36.3 C), temperature source Oral, resp. rate 18, height 5\' 10"  (1.778 m), weight 166 lb (75.297 kg).  LABORATORY DATA: Lab Results  Component Value Date   WBC 2.7* 05/29/2012   HGB 14.4 05/29/2012   HCT 42.5 05/29/2012   MCV 87.4 05/29/2012   PLT 173 05/29/2012      Chemistry      Component Value Date/Time   NA 139 05/29/2012 0815   NA 139 05/24/2011 1535   K 3.5 05/29/2012 0815   K 4.4 05/24/2011 1535   CL 105 05/29/2012 0815   CL 103 05/24/2011 1535   CO2 27 05/29/2012 0815   CO2 27 05/24/2011 1535   BUN 13.0 05/29/2012 0815   BUN 10 05/24/2011 1535   CREATININE 1.0 05/29/2012 0815   CREATININE 0.91 05/24/2011 1535      Component Value Date/Time   CALCIUM 10.3 05/29/2012 0815   CALCIUM 10.2 05/24/2011 1535   ALKPHOS 61 05/29/2012 0815   ALKPHOS 68 05/24/2011 1535   AST 16 05/29/2012 0815   AST 23 05/24/2011 1535   ALT 16 05/29/2012 0815   ALT 24 05/24/2011 1535   BILITOT 0.61 05/29/2012 0815   BILITOT 0.3 05/24/2011 1535       RADIOGRAPHIC STUDIES: Ct Soft Tissue  Neck W Contrast  05/29/2012  *RADIOLOGY REPORT*  Clinical Data: Restaging of pharyngeal cancer. Squamous cell cancer of the nasopharynx.  CT NECK WITH CONTRAST  Technique:  Multidetector CT imaging of the neck was performed with intravenous contrast.  Contrast: OMNIPAQUE IOHEXOL 300 MG/ML  SOLN  Comparison: CT of the neck 05/25/2011.CT of the neck with contrast 06/03/2008.  Findings: No residual or recurrent tumor is evident within the left nasopharynx or nasal cavity.  Chronic opacification of the left maxillary sinus is stable.  Mucosal thickening is evident along the inferior aspect of the right maxillary sinus.  No other focal mucosal or submucosal lesions are  present within the nasopharynx, oropharynx, hypopharynx, or larynx.  The no significant adenopathy is present.  Mild degenerative changes in the lower cervical spine are stable. No focal lytic or blastic lesions are evident.  The lung apices are clear.  IMPRESSION:  1.  No evidence for residual or recurrent tumor in the nasal cavity or neck. 2.  Chronic opacification of the left maxillary sinus. 3.  Similar appearance mucosal disease at the inferior aspect of the right frontal sinus. 4.  No significant adenopathy.   Original Report Authenticated By: Marin Roberts, M.D.    Ct Chest W Contrast  05/29/2012  *RADIOLOGY REPORT*  Clinical Data: Nasopharyngeal cancer diagnosed in December 2009 status post chemotherapy and radiation therapy.  CT CHEST WITH CONTRAST  Technique:  Multidetector CT imaging of the chest was performed following the standard protocol during bolus administration of intravenous contrast.  Contrast: OMNIPAQUE IOHEXOL 300 MG/ML  SOLN  Comparison: Chest CT 05/25/2011.  Findings:  Mediastinum: Heart size is normal. There is no significant pericardial fluid, thickening or pericardial calcification. No pathologically enlarged mediastinal or hilar lymph nodes. Esophagus is unremarkable in appearance.  Lungs/Pleura: Mild bilateral apical subpleural reticulation likely reflects some mild postradiation changes.  Lungs are otherwise clear.  Specifically, no acute consolidative airspace disease.  No pleural effusions.  No suspicious appearing pulmonary nodules or masses.  Upper Abdomen: Again noted is a large right adrenal nodule measuring up to 3.2 x 2.5 cm on today's examination which is grossly similar to numerous prior studies.  Musculoskeletal: There are no aggressive appearing lytic or blastic lesions noted in the visualized portions of the skeleton.  IMPRESSION: 1.  No findings to suggest metastatic disease to the thorax. 2.  Unchanged right adrenal nodule.  Although this remains  indeterminate, the stability in size and appearance and the lack of hypermetabolic activity on prior PET CT examinations suggests that this is a benign lesion such as a lipid poor adenoma.   Original Report Authenticated By: Trudie Reed, M.D.     ASSESSMENT: This is a very pleasant 41 years old white male with history of stage IVA nasopharyngeal squamous cell carcinoma diagnosed in December of 2009 status post concurrent chemoradiation. The patient has been observation since March of 2000 and with no evidence for disease recurrence.  PLAN: I discussed the scan results with the patient. I recommended for him to continue on observation with repeat CT scan of the neck and chest in one year. He was advised to call me immediately if he has any concerning symptoms in the interval. I will check his TSH with the upcoming lab in 1 year.  All questions were answered. The patient knows to call the clinic with any problems, questions or concerns. We can certainly see the patient much sooner if necessary.

## 2012-06-05 NOTE — Patient Instructions (Signed)
No evidence for disease recurrence. Followup in 1 year 

## 2012-06-06 ENCOUNTER — Telehealth: Payer: Self-pay | Admitting: Internal Medicine

## 2012-06-06 NOTE — Telephone Encounter (Signed)
S/w pt re appts for 11/17 and 06/05/2013. Per pt 11/17 lb appt scheduled for 8am. Pt is aware he has a ct already on schedule for 11/17 @ 10 am and pt will call central to change time of ct.

## 2012-09-04 ENCOUNTER — Other Ambulatory Visit: Payer: Self-pay | Admitting: Otolaryngology

## 2012-09-04 DIAGNOSIS — C099 Malignant neoplasm of tonsil, unspecified: Secondary | ICD-10-CM

## 2012-09-04 HISTORY — DX: Malignant neoplasm of tonsil, unspecified: C09.9

## 2012-09-07 ENCOUNTER — Other Ambulatory Visit: Payer: Self-pay | Admitting: Otolaryngology

## 2012-09-07 DIAGNOSIS — C099 Malignant neoplasm of tonsil, unspecified: Secondary | ICD-10-CM

## 2012-09-10 ENCOUNTER — Ambulatory Visit
Admission: RE | Admit: 2012-09-10 | Discharge: 2012-09-10 | Disposition: A | Discharge status: Home / Self Care | Payer: BC Managed Care – PPO | Source: Ambulatory Visit | Attending: Otolaryngology | Admitting: Otolaryngology

## 2012-09-10 DIAGNOSIS — C099 Malignant neoplasm of tonsil, unspecified: Secondary | ICD-10-CM

## 2012-09-10 MED ORDER — IOHEXOL 300 MG/ML  SOLN
75.0000 mL | Freq: Once | INTRAMUSCULAR | Status: AC | PRN
  Administered 2012-09-10: 75 mL via INTRAVENOUS

## 2012-10-01 HISTORY — PX: TONSILLECTOMY: SUR1361

## 2012-10-25 ENCOUNTER — Telehealth: Payer: Self-pay | Admitting: Radiation Oncology

## 2012-10-25 NOTE — Telephone Encounter (Signed)
Sent medical records to Dr. Lendell Caprice at Department of Otolaryngology

## 2012-10-28 IMAGING — CT CT CHEST W/ CM
5 of 8 series · 18 of 36 positions shown, 19 images · IV contrast (APPLIED)
Comparison: Chest CT from 11/18/2010.  PET CT from 11/03/2009.

CLINICAL DATA: Nasopharyngeal cancer.

CT CHEST WITH CONTRAST
TECHNIQUE: Multidetector CT imaging of the chest was performed
following the standard protocol during bolus administration of
intravenous contrast.
Contrast: 100mL OMNIPAQUE IOHEXOL 300 MG/ML IV SOLN

[Series 2: chest st · axial · 0.74mm/px · z∈[-434,-254]mm · 4 of 61 slices shown, 5 images]
[im 13/61  mediastinal]
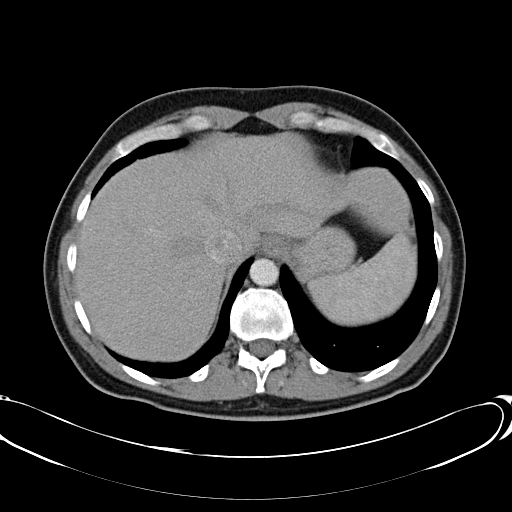
[im 13/61  lung]
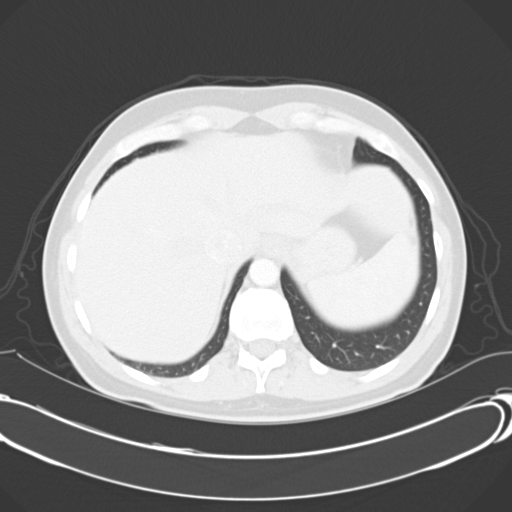
[im 25/61  lung]
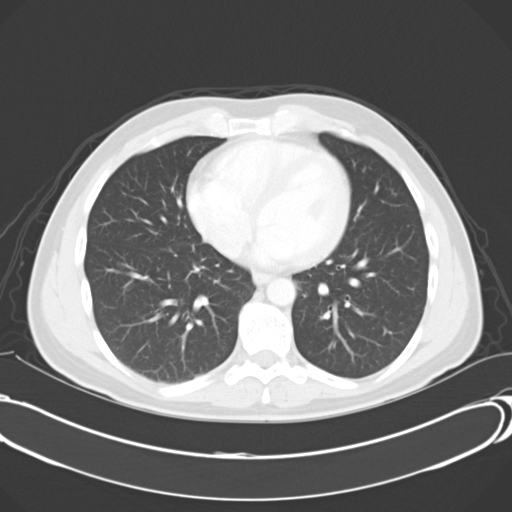
[im 37/61  lung]
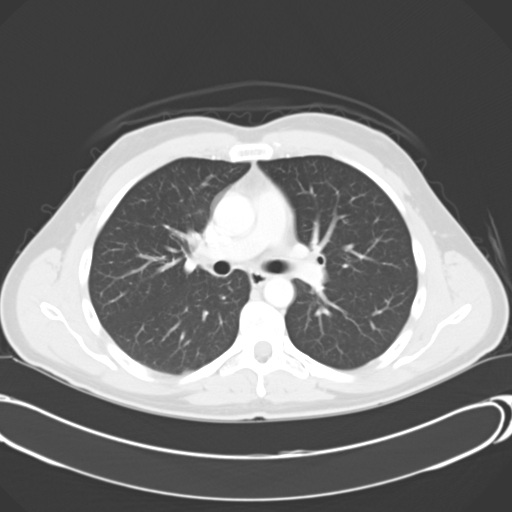
[im 49/61  lung]
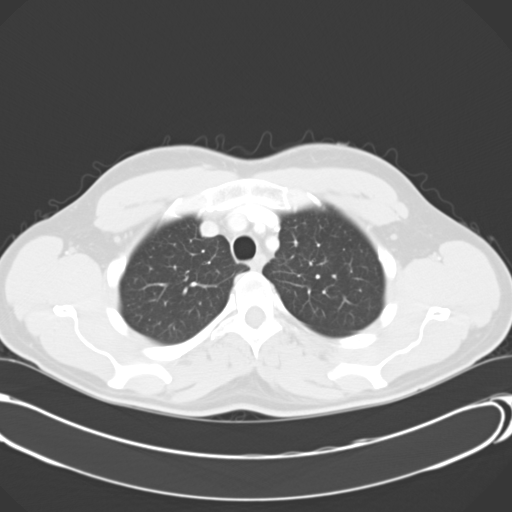

[Series 5: lung windows · axial · 0.74mm/px · z∈[-418,-254]mm · 4 of 57 slices shown]
[im 12/57  lung]
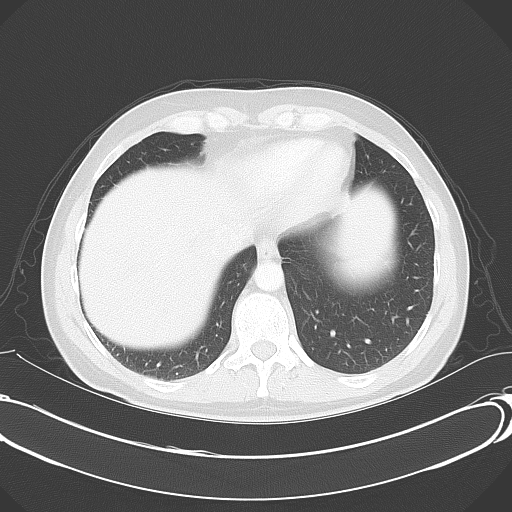
[im 23/57  lung]
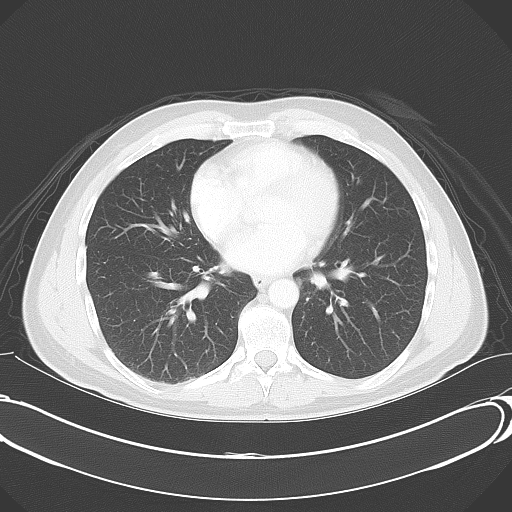
[im 34/57  lung]
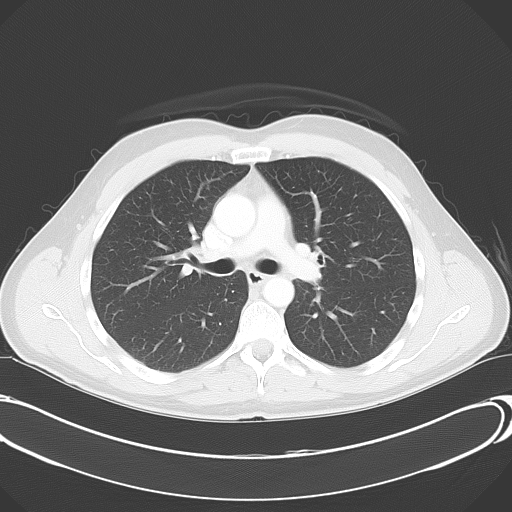
[im 45/57  lung]
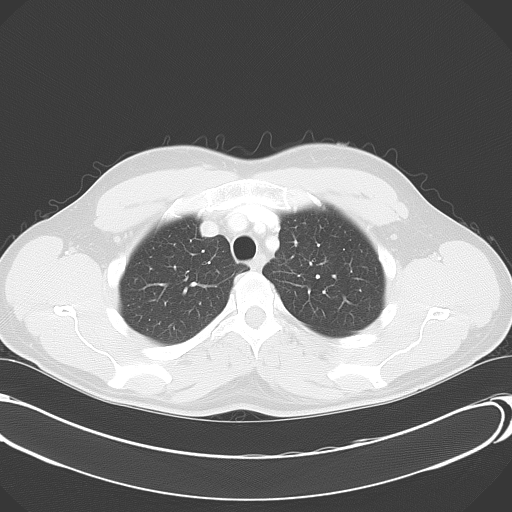

[Series 6: neck st · axial · 0.44mm/px · z∈[-224,-58]mm · 6 of 79 slices shown]
[im 12/79  lung]
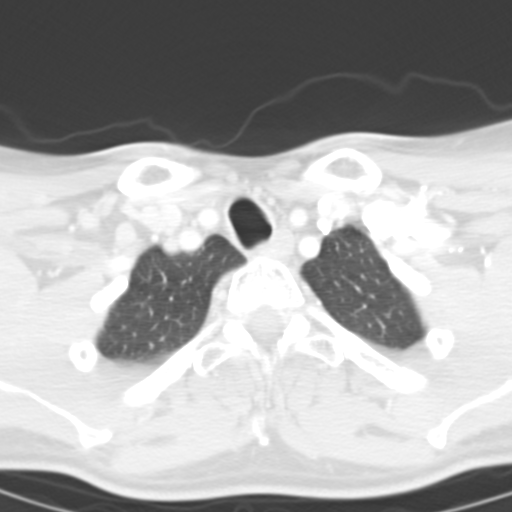
[im 23/79  lung]
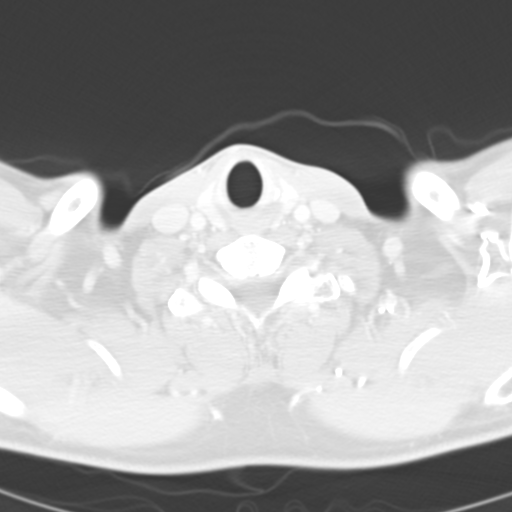
[im 34/79  lung]
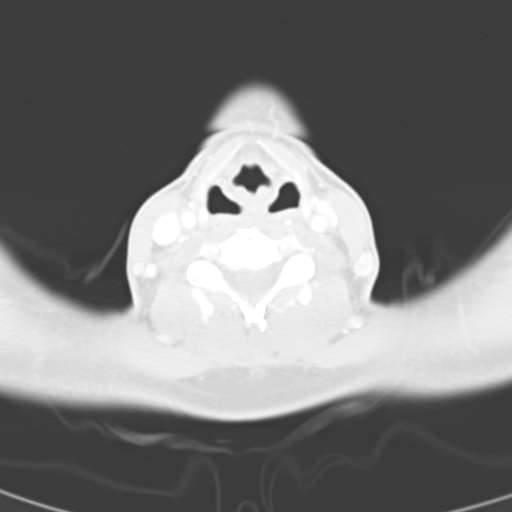
[im 45/79  lung]
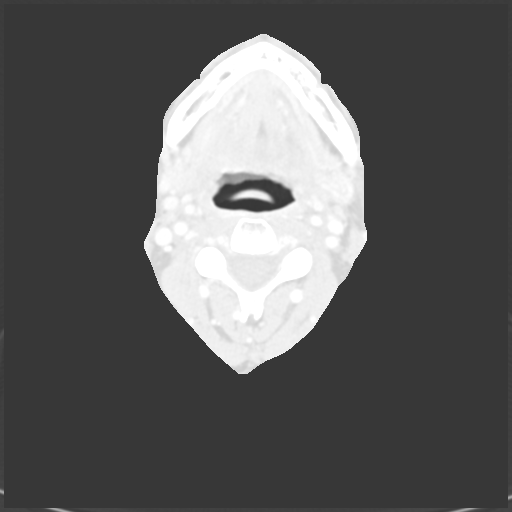
[im 56/79  lung]
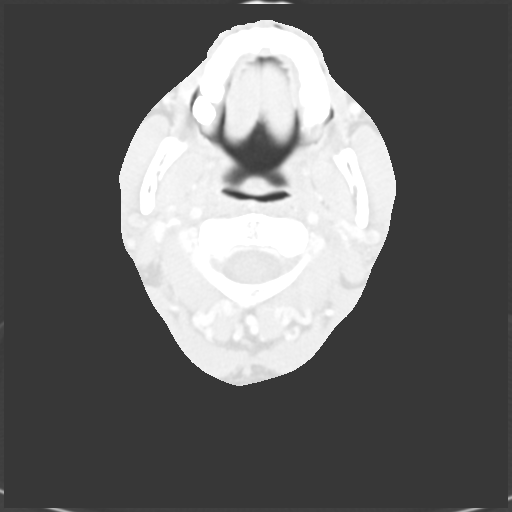
[im 67/79  lung]
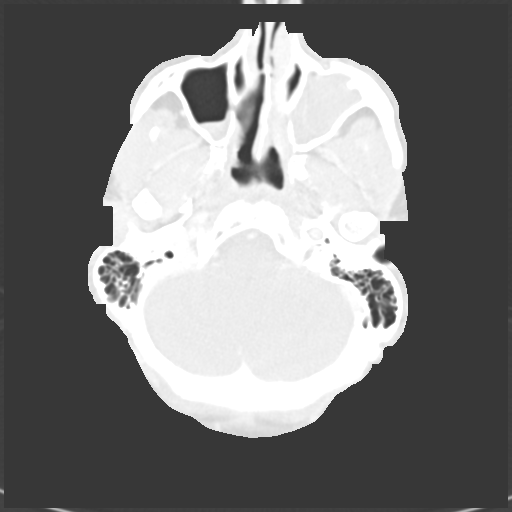

[Series 604: <mpr thick range(2)> · coronal · 0.46mm/px · 1 of 58 slices shown]
[im 29/58  lung]
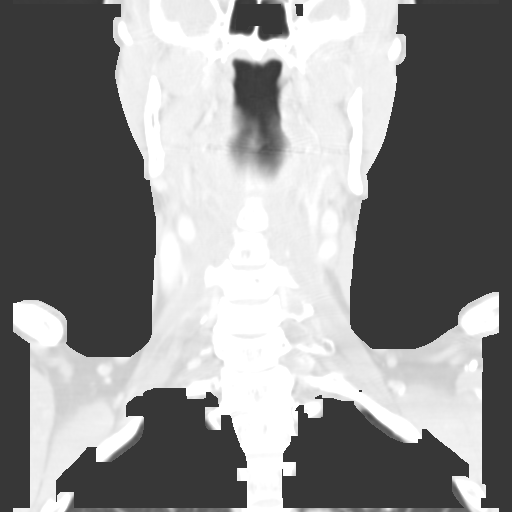

[Series 605: <mpr thick range(3)> · axial · 0.46mm/px · z∈[-272,-205]mm · 3 of 83 slices shown]
[im 12/83  lung]
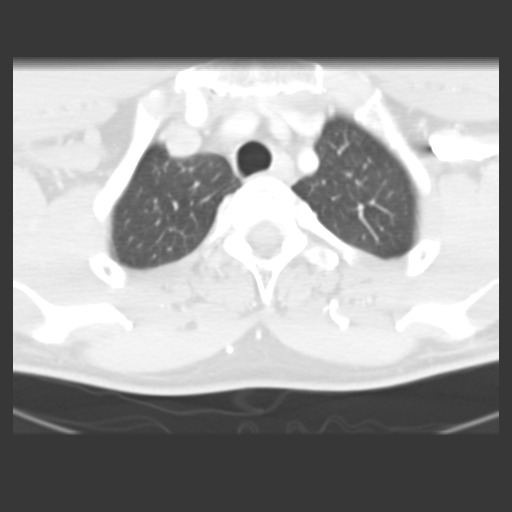
[im 24/83  lung]
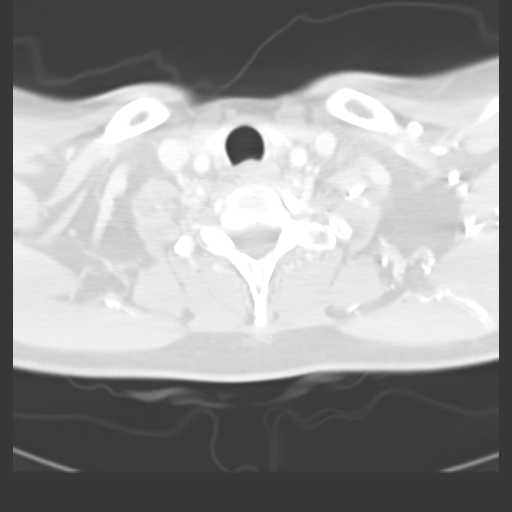
[im 36/83  lung]
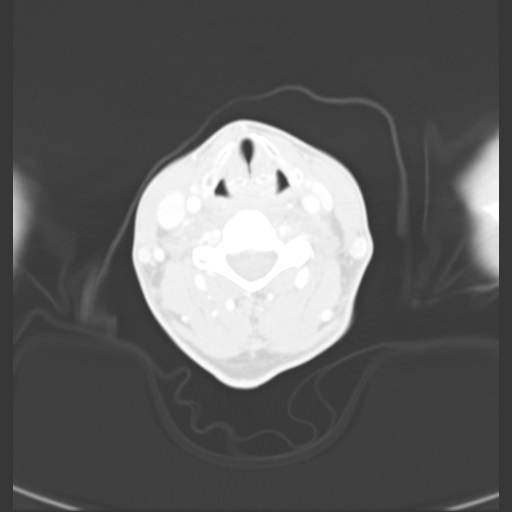

[18 of 36 positions shown; findings below may reference images not displayed]

FINDINGS: There is no axillary, mediastinal, or hilar
lymphadenopathy.  Heart size is normal.  No pericardial or pleural
effusion.

The lungs are clear without focal infiltrate, edema, pneumothorax
or pleural effusion.

Bone windows reveal no worrisome lytic or sclerotic osseous
lesions.  Right adrenal nodule is stable and 2.4 x 2.8 cm.
IMPRESSION: Stable exam.  No new or progressive disease within the chest.

No change in the right adrenal nodule.

## 2012-11-13 ENCOUNTER — Encounter: Payer: Self-pay | Admitting: *Deleted

## 2012-11-13 DIAGNOSIS — C099 Malignant neoplasm of tonsil, unspecified: Secondary | ICD-10-CM | POA: Insufficient documentation

## 2012-11-13 DIAGNOSIS — Z923 Personal history of irradiation: Secondary | ICD-10-CM | POA: Insufficient documentation

## 2012-11-13 NOTE — Progress Notes (Signed)
Head and Neck Cancer Location of Tumor / Histology: left tonsil  Patient presented 2-3 months ago with symptoms of: sore area around left tonsil, enlarging  Biopsies of left tonsil (if applicable) revealed: squamous cell carcinoma  Nutrition Status:  Weight changes: no  Swallowing status: some dysphagia  Plans, if any, for PEG tube: none at this time  Tobacco/Marijuana/Snuff/ETOH use: hx tobacco x 25 yrs  Past/Anticipated interventions by otolaryngology, if any: resection left tonsil, soft palate  Past/Anticipated interventions by medical oncology, if any: none  Referrals yet, to any of the following?  Social Work?   Dentistry?   Swallowing therapy?   Nutrition?   Med/Onc?   PEG placement?   SAFETY ISSUES:  Prior radiation? Yes, - nasopharynx, draining lymph nodes 2010  Pacemaker/ICD? no  Possible current pregnancy? no  Is the patient on methotrexate? no  Current Complaints / other details:

## 2012-11-14 ENCOUNTER — Ambulatory Visit: Payer: BC Managed Care – PPO | Admitting: Radiation Oncology

## 2012-11-14 ENCOUNTER — Ambulatory Visit: Payer: BC Managed Care – PPO

## 2012-11-16 ENCOUNTER — Ambulatory Visit
Admission: RE | Admit: 2012-11-16 | Discharge: 2012-11-16 | Disposition: A | Discharge status: Home / Self Care | Payer: 59 | Source: Ambulatory Visit | Attending: Radiation Oncology | Admitting: Radiation Oncology

## 2012-11-16 ENCOUNTER — Encounter: Payer: Self-pay | Admitting: Radiation Oncology

## 2012-11-16 VITALS — BP 126/86 | HR 60 | Temp 98.0°F | Resp 20 | Wt 154.8 lb

## 2012-11-16 DIAGNOSIS — Z923 Personal history of irradiation: Secondary | ICD-10-CM | POA: Insufficient documentation

## 2012-11-16 DIAGNOSIS — B977 Papillomavirus as the cause of diseases classified elsewhere: Secondary | ICD-10-CM | POA: Insufficient documentation

## 2012-11-16 DIAGNOSIS — Z85819 Personal history of malignant neoplasm of unspecified site of lip, oral cavity, and pharynx: Secondary | ICD-10-CM | POA: Insufficient documentation

## 2012-11-16 DIAGNOSIS — K117 Disturbances of salivary secretion: Secondary | ICD-10-CM | POA: Insufficient documentation

## 2012-11-16 DIAGNOSIS — C099 Malignant neoplasm of tonsil, unspecified: Secondary | ICD-10-CM | POA: Insufficient documentation

## 2012-11-16 DIAGNOSIS — Z87891 Personal history of nicotine dependence: Secondary | ICD-10-CM | POA: Insufficient documentation

## 2012-11-16 DIAGNOSIS — H9319 Tinnitus, unspecified ear: Secondary | ICD-10-CM | POA: Insufficient documentation

## 2012-11-16 NOTE — Progress Notes (Addendum)
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 male  well known to me who I treated for his locally advanced nasopharynx cancer 4 years ago. He has been followed with serial imaging and was doing well until about 2 months ago when he presented with a mass in his left tonsil. He had been experiencing facial and ear pain with tinnitus. A CT of the neck was performed on 09/10/2012 which showed a 3 x 2 x 3.5 cm left tonsil mass extending into the soft palate and crossing midline slightly to the right. No pathologically enlarged lymph nodes were noted. A biopsy was performed on 09/04/2012 which showed squamous cell carcinoma. He was referred for transoral resection and had that performed on 10/01/2012. This showed a 1.1 cm tumor which was grade 1 with no lymphovascular invasion and a close margin at anterior resection with tumor on the inked edge not being fully excluded. A positive margin could not be excluded on microscopic examination. He underwent reexcision of this margin and a minute focus of residual squamous cell carcinoma was noted and his margins are now negative. He has recovered well from surgery. His pain is well controlled with ibuprofen. He is no longer taking narcotics. He is able to eat and drink basically what he wants to. His voice is improving. He was seen by his surgeon, Dr. Hezzie Dunn,  earlier this week and was felt to be healing well. I discussed his case with the radiation oncologist at [facility] who recommended retreatment due to his perineural invasion. Michael Dunn continues to work full-time. He is accompanied by  his wife today. He really has no complaints other than a recurrence of his tinnitus which is not nearly as bad as it was before his surgery. 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   PREVIOUS RADIATION THERAPY: Yes previous treatment to the nasopharynx and draining lymph nodes. I have reviewed his treatment plan and this area of the left tonsil received at least 50 Gy and most of the left tonsillar fossa received 60 Gy in his prior treatment.  PAST MEDICAL HISTORY:  has a past medical history of Bipolar disorder; Cancer (07/04/2008); History of radiation therapy (08/21/08- 10/10/08); Tonsil cancer (09/04/12); Squamous cell carcinoma of tonsil (09/04/2012); History of cancer chemotherapy (2010); and Anxiety.    PAST SURGICAL HISTORY: Past Surgical History  Procedure Laterality Date  . Knee surgery  2008  . Resection of spermatocele  11/15/07  . Tonsillectomy Left 10/01/12    robotic -resection on 10/19/12, left tonsil, soft palate    FAMILY HISTORY: family history includes Diabetes in his father and Microcephaly in his brother and sister. He is adopted.  SOCIAL HISTORY:  History  Substance Use Topics  . Smoking status: Former Smoker -- 1.00 packs/day for 25 years    Types: Cigarettes    Quit date: 07/18/2010  . Smokeless tobacco: Not on file  . Alcohol Use: No    ALLERGIES: Review of patient's allergies indicates no known allergies.  MEDICATIONS:  Current Outpatient Prescriptions  Medication Sig Dispense Refill  . acetaminophen (TYLENOL) 325 MG tablet Take 650  mg by mouth every 6 (six) hours as needed for pain.      . Ibuprofen (ADVIL PO) Take by mouth as needed.         No current facility-administered medications for this encounter.    REVIEW OF SYSTEMS:  A 15 point review of systems is documented in the electronic medical record. This was obtained by the nursing staff. However, I reviewed this with the patient to discuss relevant findings and make  appropriate changes.  Pertinent items are noted in HPI.   PHYSICAL EXAM:  Filed Vitals:   11/16/12 1524  BP: 126/86  Pulse: 60  Temp: 98 F (36.7 C)  Resp: 20  . He is a thin male in no distress sitting comfortably examining table. He has no palpable cervical or subclavicular adenopathy. There is some fibrosis particularly on the right neck near his scar.  Examination of his oropharynx reveals his tongue at midline. The temperature normally. He has no trismus. His operative site is remarkably well healed. There is just a small amount of granulation tissue but really is healed up nicely. There is no evidence of thrush or other lesions. There is some retained food in his superior posterior pharyngeal wall.  LABORATORY DATA:  Lab Results  Component Value Date   WBC 2.7* 05/29/2012   HGB 14.4 05/29/2012   HCT 42.5 05/29/2012   MCV 87.4 05/29/2012   PLT 173 05/29/2012   Lab Results  Component Value Date   NA 139 05/29/2012   K 3.5 05/29/2012   CL 105 05/29/2012   CO2 27 05/29/2012   Lab Results  Component Value Date   ALT 16 05/29/2012   AST 16 05/29/2012   ALKPHOS 61 05/29/2012   BILITOT 0.61 05/29/2012     RADIOGRAPHY: No results found.    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 He has had a recurrence in previously radiated area which definitely does show that this is likely a more aggressive phenotype as well as the fact that he is a former smoker. It is however HPV positive and has been greater than 4 years since his previous treatment so this is likely a new primary. We discussed reirradiation and its inherent side effects and risk in great detail. We discussed the possibility of carotid artery damage and below out as well as death. We discussed a feeding tube dependence scar tissue trismus  and permanent xerostomia. We discussed the use of highly conformal treatment to this area which is not available in a lot of this series that report significant severe side effects. We discussed treatment of the left tonsillar fossa alone. We discussed that this would be for local control. He is obviously aware of the fact that this could recur in other places in his aerodigestive tract including his contralateral tonsil or base of tongue. We discussed the lack of randomized data showing a benefit in terms of treating at this point. We discussed close observation as an alternative. Jacques would like to do all he can to prevent recurrence of this cancer. He is interested in retreatment. I consented him for the acute effects of treatment including sore throat, taste changes and fatigue as well as the significant side effects listed above and death. I don't see a role for adjuvant chemotherapy. We should be able to keep  him under treatment without the use of the feeding tube. I think not using feeding tube will also allow him to retain more of his swallowing ability. I really am a little bit uncomfortable with treating such a small primary in the setting of reirradiation. Obviously the data is very weak. The most aggressive approach would be to treat however I'm not sure what the clinical benefit would be. I would like to get some opinions and possibly send [place] for a second opinion. He understands that this is obviously a very rare scenario and is willing to travel and do whatever is necessary to obtain these opinions. I spent 60 minutes  face to face with the patient and more than 50% of that time was spent in counseling and/or coordination of care.   ------------------------------------------------  Lurline Hare, MD

## 2012-11-16 NOTE — Progress Notes (Signed)
Please see the Nurse Progress Note in the MD Initial Consult Encounter for this patient. 

## 2012-11-16 NOTE — Addendum Note (Signed)
Encounter addended by: Lurline Hare, MD on: 11/16/2012  6:25 PM<BR>     Documentation filed: Notes Section

## 2012-11-16 NOTE — Progress Notes (Signed)
Head and Neck Cancer Location of Tumor / Histology: left tonsil  Patient presented 2-3 months ago with symptoms of: sore area around left tonsil, enlarging  Biopsies of left tonsil (if applicable) revealed: squamous cell carcinoma  Nutrition Status:  Weight changes: no  Swallowing status: some dysphagia , denies today Plans, if any, for PEG tube: none at this time  Tobacco/Marijuana/Snuff/ETOH use: hx tobacco x 25 yrs   Past/Anticipated interventions by otolaryngology, if any: resection left tonsil, soft palate   Past/Anticipated interventions by medical oncology, if any: none   Referrals yet, to any of the following?  Social Work? no Dentistry? no Swallowing therapy? no Nutrition? no Med/Onc? Established pt of Dr Arbutus Ped PEG placement? No  SAFETY ISSUES:  Prior radiation? Yes, - nasopharynx, draining lymph nodes 08/21/2008- 10/10/08  Pacemaker/ICD? no  Possible current pregnancy? no  Is the patient on methotrexate? No  Current Complaints / other details: denies pain, difficulty eating, swallowing, fatigue, loss of appetite, working full time as Investment banker, operational, wife w/pt today

## 2012-11-20 ENCOUNTER — Other Ambulatory Visit: Payer: Self-pay | Admitting: Radiation Oncology

## 2012-11-21 ENCOUNTER — Encounter: Payer: Self-pay | Admitting: Radiation Oncology

## 2012-11-21 NOTE — Progress Notes (Signed)
Received communication from [place], [place], [place] and [place].  Consensus is to not treat the tonsil and to offer a therapeutic and diagnostic left neck dissection. Discussed with Hezzie Bump who is in agreement. I would like for Michael Dunn to seek a second opinion with Dr. Maudie Flakes at [facility] as well.  Arrangements will be made for that. Close clinical follow up will be required.

## 2012-11-22 ENCOUNTER — Telehealth: Payer: Self-pay | Admitting: *Deleted

## 2012-11-22 ENCOUNTER — Encounter: Payer: Self-pay | Admitting: Radiation Oncology

## 2012-11-22 NOTE — Telephone Encounter (Signed)
Called patient to inform of appt. With Dr. Yvetta Coder in [facility] for consultation  On May 14 - arrival time - 1:00 pm, lvm for a return call- address- [address] in [place], [place]. - [phone], lvm for a return call

## 2012-11-22 NOTE — Progress Notes (Signed)
Discussed with the patient today he the recommendations I received from outside centers. We've also given him appointments for [facility] for next Wednesday. I discussed his neck dissection with him. He is amenable to this plan. He strongly believes he would rather have 3-5 good years rather than 20 pack years with a feeding tube and the possible side effects of reirradiation. Make an appointment with him in one month's time. This is to followup on his surgery and other recommendations and can certainly be moved if needed.

## 2012-11-23 ENCOUNTER — Telehealth: Payer: Self-pay | Admitting: *Deleted

## 2012-11-23 ENCOUNTER — Other Ambulatory Visit: Payer: Self-pay | Admitting: Radiation Oncology

## 2012-11-23 DIAGNOSIS — C140 Malignant neoplasm of pharynx, unspecified: Secondary | ICD-10-CM

## 2012-11-23 NOTE — Telephone Encounter (Signed)
CALLED PATIENT TO INFORM OF APPTS.  LVM FOR A RETURN CALL

## 2012-11-23 NOTE — Telephone Encounter (Signed)
Called patient to inform of appt. With Dr. Caralyn Guile on May 27 arrival time - 3:00 pm- address- [address] , ph. No. - [phone], lvm for a return call

## 2012-11-26 NOTE — Addendum Note (Signed)
Encounter addended by: Glennie Hawk, RN on: 11/26/2012 11:19 AM<BR>     Documentation filed: Charges VN

## 2012-11-28 ENCOUNTER — Telehealth: Payer: Self-pay | Admitting: Radiation Oncology

## 2012-11-28 NOTE — Telephone Encounter (Signed)
Per SW, faxed NPE 11/16/12, path 09/04/12 to [employer], [phone].  Received confirmation.

## 2012-11-29 ENCOUNTER — Encounter: Payer: Self-pay | Admitting: Radiation Oncology

## 2012-11-29 NOTE — Progress Notes (Signed)
I spoke to the patient today. He had a good experience with radiation oncology at [facility]. Apparently Dr. Genella Mech has requested his pathology slides to determine the amount of perineural invasion involved. If there is a lot apparently he might recommend radiation. If there's not much than he would not offer radiation. The patient also has a second opinion with a surgeon at [facility] next week regarding his neck dissection. He has come off the tramadol. He would like to hold off on his counseling appointment. I encouraged him to keep that appointment. I have made a followup appointment with him in a couple weeks. I encouraged him to call with any questions.

## 2012-12-11 ENCOUNTER — Ambulatory Visit: Payer: BC Managed Care – PPO | Admitting: Psychology

## 2012-12-11 ENCOUNTER — Telehealth: Payer: Self-pay | Admitting: Internal Medicine

## 2012-12-11 NOTE — Telephone Encounter (Signed)
movedn 5/29 appt to 5/30 due to [employer]. lmonvm for pt re new d/t w/my direct # asking that pt call me if this is not a good d/t.

## 2012-12-12 ENCOUNTER — Telehealth: Payer: Self-pay | Admitting: Internal Medicine

## 2012-12-12 NOTE — Telephone Encounter (Signed)
wanted to cancel and not r/s he will call back if he needs to r/s..under a surgeons care

## 2012-12-13 ENCOUNTER — Encounter: Payer: BC Managed Care – PPO | Admitting: Nutrition

## 2012-12-14 ENCOUNTER — Encounter: Payer: BC Managed Care – PPO | Admitting: Nutrition

## 2012-12-19 ENCOUNTER — Telehealth: Payer: Self-pay | Admitting: *Deleted

## 2012-12-19 NOTE — Telephone Encounter (Signed)
Called patient phone [phone], answering machine, left voice message for a return call to see where he is in terms of treatment, ,possibly surgery/radiation, 2nd opinion at [facility], ?  11:16 AM

## 2013-01-03 ENCOUNTER — Encounter: Payer: Self-pay | Admitting: Radiation Oncology

## 2013-01-03 NOTE — Progress Notes (Signed)
I spoke with the patient yesterday and with Dr. Kathleen Lime Today.  The patient has recovered well from neck dissection. No nodes were involved. I reassured him that he could receive radiation, if that was the recommendation, here or at [facility].  I encouraged him to see a counselor as we had discussed prior to his neck surgery.   In speaking with Dr. Maudie Flakes he thought there was a 20-25% chance of local recurrence given the PNI and then a 20-25% chance of major , severe or life threatening events associated with reirradiation.  They discussed this in great detail.  The ENT surgeon is seeing him in 2 months.   I am planning to see Mr. Prentiss Bells tomorrow and then will see him every 2-3 months along with ENT.

## 2013-01-04 ENCOUNTER — Ambulatory Visit
Admission: RE | Admit: 2013-01-04 | Payer: BC Managed Care – PPO | Source: Ambulatory Visit | Admitting: Radiation Oncology

## 2013-04-15 ENCOUNTER — Other Ambulatory Visit: Payer: Self-pay | Admitting: Internal Medicine

## 2013-04-15 DIAGNOSIS — C140 Malignant neoplasm of pharynx, unspecified: Secondary | ICD-10-CM

## 2013-04-16 ENCOUNTER — Telehealth: Payer: Self-pay | Admitting: Internal Medicine

## 2013-04-16 NOTE — Telephone Encounter (Signed)
returned pt call and r/s all appts ok per Dr. Kerry Fort

## 2013-05-28 ENCOUNTER — Telehealth: Payer: Self-pay | Admitting: Internal Medicine

## 2013-05-28 NOTE — Telephone Encounter (Signed)
pt called to r/s est appt after ct scan..done

## 2013-05-29 ENCOUNTER — Other Ambulatory Visit (HOSPITAL_COMMUNITY): Payer: 59

## 2013-05-29 ENCOUNTER — Ambulatory Visit (HOSPITAL_COMMUNITY): Payer: 59

## 2013-05-29 ENCOUNTER — Other Ambulatory Visit: Payer: Self-pay | Admitting: *Deleted

## 2013-05-29 ENCOUNTER — Other Ambulatory Visit (HOSPITAL_BASED_OUTPATIENT_CLINIC_OR_DEPARTMENT_OTHER): Payer: 59 | Admitting: Lab

## 2013-05-29 DIAGNOSIS — C140 Malignant neoplasm of pharynx, unspecified: Secondary | ICD-10-CM

## 2013-05-29 LAB — COMPREHENSIVE METABOLIC PANEL (CC13)
AST: 17 U/L (ref 5–34)
Albumin: 4.4 g/dL (ref 3.5–5.0)
Alkaline Phosphatase: 67 U/L (ref 40–150)
Glucose: 91 mg/dl (ref 70–140)
Potassium: 4.2 mEq/L (ref 3.5–5.1)
Sodium: 142 mEq/L (ref 136–145)
Total Protein: 7.3 g/dL (ref 6.4–8.3)

## 2013-05-29 LAB — CBC WITH DIFFERENTIAL/PLATELET
EOS%: 1.9 % (ref 0.0–7.0)
Eosinophils Absolute: 0.1 10*3/uL (ref 0.0–0.5)
MCV: 87.8 fL (ref 79.3–98.0)
MONO%: 10.1 % (ref 0.0–14.0)
NEUT#: 3.3 10*3/uL (ref 1.5–6.5)
RBC: 4.8 10*6/uL (ref 4.20–5.82)
RDW: 13.1 % (ref 11.0–14.6)

## 2013-05-30 ENCOUNTER — Other Ambulatory Visit: Payer: BC Managed Care – PPO

## 2013-06-03 ENCOUNTER — Ambulatory Visit (HOSPITAL_COMMUNITY): Payer: BC Managed Care – PPO

## 2013-06-03 ENCOUNTER — Ambulatory Visit: Payer: 59 | Admitting: Internal Medicine

## 2013-06-03 ENCOUNTER — Other Ambulatory Visit: Payer: BC Managed Care – PPO | Admitting: Lab

## 2013-06-04 ENCOUNTER — Other Ambulatory Visit (HOSPITAL_COMMUNITY): Payer: 59

## 2013-06-05 ENCOUNTER — Ambulatory Visit (HOSPITAL_COMMUNITY)
Admission: RE | Admit: 2013-06-05 | Discharge: 2013-06-05 | Disposition: A | Discharge status: Home / Self Care | Payer: 59 | Source: Ambulatory Visit | Attending: Internal Medicine | Admitting: Internal Medicine

## 2013-06-05 ENCOUNTER — Ambulatory Visit: Payer: BC Managed Care – PPO | Admitting: Internal Medicine

## 2013-06-05 ENCOUNTER — Ambulatory Visit: Payer: 59 | Admitting: Internal Medicine

## 2013-06-05 ENCOUNTER — Encounter (HOSPITAL_COMMUNITY): Payer: Self-pay

## 2013-06-05 DIAGNOSIS — C140 Malignant neoplasm of pharynx, unspecified: Secondary | ICD-10-CM

## 2013-06-05 DIAGNOSIS — Z85819 Personal history of malignant neoplasm of unspecified site of lip, oral cavity, and pharynx: Secondary | ICD-10-CM | POA: Insufficient documentation

## 2013-06-05 DIAGNOSIS — C099 Malignant neoplasm of tonsil, unspecified: Secondary | ICD-10-CM | POA: Insufficient documentation

## 2013-06-05 DIAGNOSIS — Z923 Personal history of irradiation: Secondary | ICD-10-CM | POA: Insufficient documentation

## 2013-06-05 DIAGNOSIS — Z9221 Personal history of antineoplastic chemotherapy: Secondary | ICD-10-CM | POA: Insufficient documentation

## 2013-06-05 MED ORDER — IOHEXOL 300 MG/ML  SOLN
100.0000 mL | Freq: Once | INTRAMUSCULAR | Status: AC | PRN
  Administered 2013-06-05: 100 mL via INTRAVENOUS

## 2013-06-10 ENCOUNTER — Other Ambulatory Visit: Payer: Self-pay | Admitting: *Deleted

## 2013-06-10 DIAGNOSIS — C099 Malignant neoplasm of tonsil, unspecified: Secondary | ICD-10-CM

## 2013-06-11 ENCOUNTER — Encounter: Payer: Self-pay | Admitting: Internal Medicine

## 2013-06-11 ENCOUNTER — Other Ambulatory Visit: Payer: 59 | Admitting: Lab

## 2013-06-11 ENCOUNTER — Ambulatory Visit (HOSPITAL_BASED_OUTPATIENT_CLINIC_OR_DEPARTMENT_OTHER): Payer: 59 | Admitting: Internal Medicine

## 2013-06-11 VITALS — BP 130/84 | HR 65 | Temp 97.3°F | Resp 18 | Ht 70.0 in | Wt 172.8 lb

## 2013-06-11 DIAGNOSIS — C099 Malignant neoplasm of tonsil, unspecified: Secondary | ICD-10-CM

## 2013-06-11 DIAGNOSIS — C140 Malignant neoplasm of pharynx, unspecified: Secondary | ICD-10-CM

## 2013-06-11 DIAGNOSIS — Z8521 Personal history of malignant neoplasm of larynx: Secondary | ICD-10-CM

## 2013-06-11 NOTE — Progress Notes (Signed)
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 Last dose was given September 29, 2008.  2) Transoral left tonsillectomy on 10/01/2012 with a reresection of a posterior margin on 10/29/2012 close the resection margin  at [facility].  CURRENT THERAPY: Observation.  INTERVAL HISTORY: Michael Dunn 42 y.o. male returns to the clinic today for routine annual followup visit. He is recovering well from his recent surgery with left tonsillectomy but left him with change in his voice. The patient is feeling fine today with no specific complaints. He denied having any significant weight loss or night sweats. He denied having any swallowing difficulty. He still using water at regular basis because of the decreased saliva. The patient denied having any significant chest pain, shortness breath, cough or hemoptysis. He had repeat CT scan of the neck and chest performed recently and he is here today for evaluation and discussion of his scan results.   MEDICAL HISTORY: Past Medical History  Diagnosis Date  . Bipolar disorder   . Cancer 07/04/2008    squamous cell ca of nasal pharynx  . History of radiation therapy 08/21/08- 10/10/08    nasopharynx, draining lymph nodes  . Tonsil cancer 09/04/12    L tonsil- squamous cell  . Squamous cell carcinoma of tonsil 09/04/2012    left  . History of cancer chemotherapy 2010  . Anxiety     ALLERGIES:  has No Known Allergies.  MEDICATIONS:  Current Outpatient Prescriptions  Medication Sig Dispense Refill  . acetaminophen (TYLENOL) 325 MG tablet Take 650 mg by mouth every 6 (six) hours as needed for pain.      .  Ibuprofen (ADVIL PO) Take by mouth as needed.         No current facility-administered medications for this visit.    SURGICAL HISTORY:  Past Surgical History  Procedure Laterality Date  . Knee surgery  2008  . Resection of spermatocele  11/15/07  . Tonsillectomy Left 10/01/12    robotic -resection on 10/19/12, left tonsil, soft palate    REVIEW OF SYSTEMS:  A comprehensive review of systems was negative.   PHYSICAL EXAMINATION: General appearance: alert, cooperative and appears stated age Head: Normocephalic, without obvious abnormality, atraumatic Neck: no adenopathy Resp: clear to auscultation bilaterally Cardio: regular rate and rhythm, S1, S2 normal, no murmur, click, rub or gallop GI: soft, non-tender; bowel sounds normal; no masses,  no organomegaly Extremities: extremities normal, atraumatic, no cyanosis or edema  ECOG PERFORMANCE STATUS: 0 - Asymptomatic  Blood pressure 130/84, pulse 65, temperature 97.3 F (36.3 C), temperature source Oral, resp. rate 18, height 5\' 10"  (1.778 m), weight 172 lb 12.8 oz (78.382 kg).  LABORATORY DATA: Lab Results  Component Value Date   WBC 4.7 05/29/2013   HGB 14.3 05/29/2013   HCT 42.1 05/29/2013   MCV 87.8 05/29/2013   PLT 207 05/29/2013      Chemistry      Component Value Date/Time   NA 142 05/29/2013 1458   NA 139 05/24/2011 1535   K 4.2 05/29/2013 1458   K 4.4 05/24/2011 1535   CL 105 05/29/2012 0815   CL 103 05/24/2011 1535  CO2 24 05/29/2013 1458   CO2 27 05/24/2011 1535   BUN 17.3 05/29/2013 1458   BUN 10 05/24/2011 1535   CREATININE 1.0 05/29/2013 1458   CREATININE 0.91 05/24/2011 1535      Component Value Date/Time   CALCIUM 10.0 05/29/2013 1458   CALCIUM 10.2 05/24/2011 1535   ALKPHOS 67 05/29/2013 1458   ALKPHOS 68 05/24/2011 1535   AST 17 05/29/2013 1458   AST 23 05/24/2011 1535   ALT 12 05/29/2013 1458   ALT 24 05/24/2011 1535   BILITOT 0.39 05/29/2013 1458   BILITOT 0.3 05/24/2011 1535       RADIOGRAPHIC  STUDIES: Ct Soft Tissue Neck W Contrast  06/05/2013   CLINICAL DATA:  42 year old male with history of tonsillar carcinoma diagnosed earlier this year, lymph node resection, chemotherapy and radiation. Squamous cell carcinoma. Prior history of left nasopharyngeal carcinoma in 2010. Restaging. Subsequent encounter.  EXAM: CT NECK WITH CONTRAST  TECHNIQUE: Multidetector CT imaging of the neck was performed using the standard protocol following the bolus administration of intravenous contrast.  CONTRAST:  OMNIPAQUE IOHEXOL 300 MG/ML SOLN in conjunction with CT of the chest reported separately.  COMPARISON:  09/10/2012 and earlier.  FINDINGS: Chest findings are reported separately.  Interval regressed left tonsillar soft tissue mass, no longer visible. Capacious appearance now of the left loss a tonsillar sulcus. Stable nasopharyngeal contour. Hypopharynx and larynx are stable. Retropharyngeal, parapharyngeal spaces, sublingual space, and parotid glands remain within normal limits.  New sequelae of left submandibular space and level 2 lymph node dissection with small surgical clips. Postradiation appearance of the submandibular glands. No cervical lymphadenopathy. No new or increased lymph nodes identified.  Negative thyroid. Major vascular structures in the neck and at the skullbase are patent, with dominant right IJ re- identified.  Visualized orbit soft tissues are within normal limits. Negative visualized brain parenchyma.  Stable paranasal sinuses and nasal cavity. Mastoids and tympanic cavities are clear. Stable visualized osseous structures.  IMPRESSION: 1. satisfactory posttherapy appearance of the neck with resolved left tonsillar mass. Sequelae of radiation therapy. Interval left lymph node dissection. No lymphadenopathy.  2.  Chest findings are reported separately.   Electronically Signed   By: Augusto Gamble M.D.   On: 06/05/2013 15:53   Ct Chest W Contrast  06/05/2013   CLINICAL DATA:  Pharyngeal  cancer.  EXAM: CT CHEST WITH CONTRAST  TECHNIQUE: Multidetector CT imaging of the chest was performed during intravenous contrast administration.  CONTRAST:  OMNIPAQUE IOHEXOL 300 MG/ML  SOLN  COMPARISON:  Chest CT 05/29/2012.  FINDINGS: Mediastinum: Heart size is normal. There is no significant pericardial fluid, thickening or pericardial calcification. No pathologically enlarged supraclavicular, mediastinal or hilar lymph nodes. Esophagus is unremarkable in appearance.  Lungs/Pleura: No suspicious appearing pulmonary nodules or masses. No acute consolidative airspace disease. No pleural effusions.  Upper Abdomen: No change in a 3.2 x 2.5 cm nodule in the right adrenal gland.  Musculoskeletal: There are no aggressive appearing lytic or blastic lesions noted in the visualized portions of the skeleton.  IMPRESSION: 1. No evidence to suggest metastatic disease to the thorax on today's examination. 2. No acute findings in the thorax. 3. Unchanged 3.2 x 2.5 cm right adrenal nodule. Given the stability in size of this nodule over time, and a lack of significant metabolic activity within this lesion on prior PET examinations, this is strongly favored to be benign lesion, presumably a lipid poor adenoma.   Electronically Signed   By: Reuel Boom  Entrikin M.D.   On: 06/05/2013 15:58    ASSESSMENT AND PLAN: This is a very pleasant 42 years old white male with history of stage IVA nasopharyngeal squamous cell carcinoma diagnosed in December of 2009 status post concurrent chemoradiation. He was diagnosed in February of 2014 with tonsillar squamous cell carcinoma status post resection. His recent CT scan of the neck and chest showed no evidence for disease recurrence. I discussed the scan results with the patient. I recommended for him to continue on observation with repeat CT scan of the neck and chest in one year. He will continue his routine followup visit and laryngoscopy by his ENT surgeon at [facility]  as previously scheduled. He was advised to call immediately if he has any concerning symptoms in the interval. All questions were answered. The patient knows to call the clinic with any problems, questions or concerns. We can certainly see the patient much sooner if necessary.

## 2013-06-11 NOTE — Patient Instructions (Signed)
Followup visit in one year with repeat CT scan of the neck and chest.

## 2013-06-12 ENCOUNTER — Telehealth: Payer: Self-pay | Admitting: Internal Medicine

## 2013-06-12 NOTE — Telephone Encounter (Signed)
lvm for pt regarding to NOV and DEc appts....mailed pt avs and letter

## 2014-06-10 ENCOUNTER — Ambulatory Visit (HOSPITAL_COMMUNITY): Admit: 2014-06-10 | Payer: 59

## 2014-06-10 ENCOUNTER — Other Ambulatory Visit: Payer: 59

## 2014-06-17 ENCOUNTER — Ambulatory Visit: Payer: 59 | Admitting: Internal Medicine

## 2017-02-24 ENCOUNTER — Telehealth: Payer: Self-pay | Admitting: *Deleted

## 2017-02-24 NOTE — Telephone Encounter (Signed)
On 02-24-17 fax medical records to [facility], it was consult note, end of tx note, sim & planning note, and dos will do there part.

## 2017-03-31 ENCOUNTER — Emergency Department (HOSPITAL_COMMUNITY)
Admission: EM | Admit: 2017-03-31 | Discharge: 2017-03-31 | Disposition: A | Discharge status: Home / Self Care | Payer: 59 | Attending: Emergency Medicine | Admitting: Emergency Medicine

## 2017-03-31 ENCOUNTER — Emergency Department (HOSPITAL_COMMUNITY): Payer: 59

## 2017-03-31 ENCOUNTER — Encounter (HOSPITAL_COMMUNITY): Payer: Self-pay | Admitting: Emergency Medicine

## 2017-03-31 DIAGNOSIS — Z85818 Personal history of malignant neoplasm of other sites of lip, oral cavity, and pharynx: Secondary | ICD-10-CM | POA: Diagnosis not present

## 2017-03-31 DIAGNOSIS — Z79899 Other long term (current) drug therapy: Secondary | ICD-10-CM | POA: Diagnosis not present

## 2017-03-31 DIAGNOSIS — Z430 Encounter for attention to tracheostomy: Secondary | ICD-10-CM | POA: Diagnosis present

## 2017-03-31 DIAGNOSIS — C329 Malignant neoplasm of larynx, unspecified: Secondary | ICD-10-CM | POA: Diagnosis not present

## 2017-03-31 DIAGNOSIS — J9500 Unspecified tracheostomy complication: Secondary | ICD-10-CM | POA: Insufficient documentation

## 2017-03-31 DIAGNOSIS — Z87891 Personal history of nicotine dependence: Secondary | ICD-10-CM | POA: Insufficient documentation

## 2017-03-31 MED ORDER — HEPARIN SOD (PORK) LOCK FLUSH 100 UNIT/ML IV SOLN: 500.0000 [IU] | Freq: Once | INTRAVENOUS | Status: DC

## 2017-03-31 MED ORDER — LORAZEPAM 2 MG/ML IJ SOLN
1.0000 mg | Freq: Once | INTRAMUSCULAR | Status: AC
  Administered 2017-03-31: 1 mg via INTRAVENOUS
  Filled 2017-03-31: qty 1

## 2017-03-31 MED ORDER — HEPARIN SOD (PORK) LOCK FLUSH 100 UNIT/ML IV SOLN
INTRAVENOUS | Status: AC
  Administered 2017-03-31: 500 [IU]
  Filled 2017-03-31: qty 10

## 2017-03-31 MED ORDER — LORAZEPAM 2 MG/ML IJ SOLN
2.0000 mg | Freq: Once | INTRAMUSCULAR | Status: AC
  Administered 2017-03-31: 2 mg via INTRAVENOUS
  Filled 2017-03-31: qty 1

## 2017-03-31 MED ORDER — HEPARIN SOD (PORK) LOCK FLUSH 100 UNIT/ML IV SOLN
500.0000 [IU] | Freq: Once | INTRAVENOUS | Status: AC
  Administered 2017-03-31: 500 [IU]

## 2017-03-31 NOTE — ED Notes (Signed)
Patient is requesting something for pain. Currently wearing a fentanyl patch, but generally supplements with dilaudid and tylenol.

## 2017-03-31 NOTE — Progress Notes (Signed)
Dr. Johnney Killian and Respiratory Therapy at bedside. Patent airway established with 5.5 ETT by EMS. Attemptted to replace with #6 CFS Shiley trach, but unsuccessful. Downsized to #4 CFS Shiley trach, and placed successfully. Good color change with CO2 Cap. SAT 100%. Pt placed on 28% ATC for hydration. Suctioned thick yellow secretions. RT will continue to monitor.

## 2017-03-31 NOTE — ED Notes (Addendum)
Single Lumen PICC line to left arm, pt reports PICC is evaluated and flushed regularly, last flush was 1 week ago. X-ray today confirms appropriate placement of PICC line. PICC line flushed with heparin as per [facility] prior to patient discharge.

## 2017-03-31 NOTE — ED Provider Notes (Signed)
[facility] DEPT Provider Note   CSN: [id number] Arrival date & time: 03/31/17  1143     History   Chief Complaint Chief Complaint  Patient presents with  . Tracheostomy Tube Change    HPI Michael Dunn is a 46 y.o. male.  HPI Patient pulled his tracheostomy tube out because it had gotten occluded by mucus. He felt like he couldn't breathe. He reports he had not been using the inner cannula.patient has complex medical history with recurrent laryngeal cancer. Patient is undergoing radiation therapy and immunotherapy. Past Medical History:  Diagnosis Date  . Anxiety   . Bipolar disorder ([place])   . Cancer ([place]) 07/04/2008   squamous cell ca of nasal pharynx  . History of cancer chemotherapy 2010  . History of radiation therapy 08/21/08- 10/10/08   nasopharynx, draining lymph nodes  . Squamous cell carcinoma of tonsil ([place]) 09/04/2012   left  . Tonsil cancer ([place]) 09/04/12   L tonsil- squamous cell    Patient Active Problem List   Diagnosis Date Noted  . History of radiation therapy   . Tonsil cancer ([place])   . Squamous cell carcinoma of tonsil ([place])   . 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 Cancer ([place]) 07/04/2008    Past Surgical History:  Procedure Laterality Date  . KNEE SURGERY  2008  . resection of spermatocele  11/15/07  . TONSILLECTOMY Left 10/01/12   robotic -resection on 10/19/12, left tonsil, soft palate       Home Medications    Prior to Admission medications   Medication Sig Start Date End Date Taking? Authorizing Provider  acetaminophen (TYLENOL) 160 MG/5ML solution Place 649.6 mg into feeding tube every 6 (six) hours.   Yes [provider]  ceFAZolin (ANCEF) 2-4 GM/100ML-% IVPB Inject 2 g into the vein every 8 (eight) hours. Started 09/02 for 4 weeks   Yes [provider]  cholecalciferol (VITAMIN D) 1000 units tablet Take 1,000 Units by mouth daily.   Yes [provider]  fentaNYL (DURAGESIC - DOSED MCG/HR) 100 MCG/HR Place 100  mcg onto the skin every 3 (three) days.   Yes [provider]  gabapentin (NEURONTIN) 800 MG tablet Take 800 mg by mouth 3 (three) times daily.   Yes [provider]  HYDROmorphone (DILAUDID) 8 MG tablet Take 4-8 mg by mouth every 4 (four) hours as needed for severe pain.   Yes [provider]  ibuprofen (ADVIL,MOTRIN) 200 MG tablet Take 800 mg by mouth every 6 (six) hours as needed for fever, headache, mild pain, moderate pain or cramping.   Yes [provider]  levothyroxine (SYNTHROID, LEVOTHROID) 88 MCG tablet Take 88 mcg by mouth daily before breakfast.   Yes [provider]  meloxicam (MOBIC) 15 MG tablet Take 15 mg by mouth daily.   Yes [provider]  Multiple Vitamin (MULTIVITAMIN WITH MINERALS) TABS tablet Take 1 tablet by mouth daily.   Yes [provider]  nivolumab (OPDIVO) 240 MG/24ML SOLN chemo injection Inject 240 mg into the vein every 14 (fourteen) days. *Given at [facility]*   Yes [provider]  omeprazole (PRILOSEC) 20 MG capsule Take 40 mg by mouth daily.   Yes [provider]  sennosides-docusate sodium (SENOKOT-S) 8.6-50 MG tablet Place 2 tablets into feeding tube 2 (two) times daily.   Yes [provider]    Family History Family History  Problem Relation Age of Onset  . Adopted: Yes  . Diabetes Father   . Microcephaly  Sister   . Microcephaly Brother     Social History Social History  Substance Use Topics  . Smoking status: Former Smoker    Packs/day: 1.00    Years: 25.00    Types: Cigarettes    Quit date: 07/18/2010  . Smokeless tobacco: Not on file  . Alcohol use No     Allergies   Patient has no known allergies.   Review of Systems Review of Systems Constitutional: No fevers no chills Respiratory: Chronic mucus productive cough. Not changed from baseline.  Physical Exam Updated Vital Signs BP (!) 156/99   Pulse 70   Temp 97.9 F (36.6 C) (Oral)    Resp 16   SpO2 100%   Physical Exam  Constitutional: He is oriented to person, place, and time.  Patient is alert and nontoxic. He does not have respiratory distress.  Eyes: EOM are normal.  Neck:  Patient has mature tracheostomy site. On arrival 5.0 endotracheal tube is in place as placed by EMS. A shunt is breathing through this spontaneously without difficulty.  Cardiovascular: Normal rate and regular rhythm.   Pulmonary/Chest:  Intermittent wet cough. Productive of creamy mucus. Air flow throughout the lung fields. Occasional rhonchi.  Abdominal: Soft. He exhibits no distension.  Musculoskeletal: Normal range of motion.  Neurological: He is alert and oriented to person, place, and time. No cranial nerve deficit. He exhibits normal muscle tone. Coordination normal.  Skin: Skin is warm and dry.  Psychiatric: He has a normal mood and affect.     ED Treatments / Results  Labs (all labs ordered are listed, but only abnormal results are displayed) Labs Reviewed - No data to display  EKG  EKG Interpretation None       Radiology Dg Chest 2 View  Result Date: 03/31/2017 CLINICAL DATA:  Cough. EXAM: CHEST  2 VIEW COMPARISON:  CT chest dated June 05, 2013. FINDINGS: Tracheostomy tube in good position with the tip at thoracic inlet. Left sided PICC line with the tip at the cavoatrial junction. The cardiomediastinal silhouette is normal in size. Normal pulmonary vascularity. Bibasilar atelectasis. No focal consolidation, pleural effusion, or pneumothorax. No acute osseous abnormality. IMPRESSION: No active cardiopulmonary disease. Electronically Signed   By: Titus Dubin M.D.   On: 03/31/2017 13:31    Procedures TRACHEOSTOMY REPLACEMENT Date/Time: 03/31/2017 3:18 PM Performed by: Charlesetta Shanks Authorized by: Charlesetta Shanks  Consent: Verbal consent obtained. Consent given by: patient Patient understanding: patient states understanding of the procedure being  performed Indications: became dislodged Local anesthesia used: no  Anesthesia: Local anesthesia used: no  Sedation: Patient sedated: yes Sedation type: anxiolysis Sedatives: lorazepam  Tube type: single cannula Tube cuff: cuffless Tube size: 6.0 mm Patient tolerance: Patient tolerated the procedure well with no immediate complications    (including critical care time)  Medications Ordered in ED Medications  LORazepam (ATIVAN) injection 2 mg (2 mg Intravenous Given 03/31/17 1211)  LORazepam (ATIVAN) injection 1 mg (1 mg Intravenous Given 03/31/17 1503)     Initial Impression / Assessment and Plan / ED Course  I have reviewed the triage vital signs and the nursing notes.  Pertinent labs & imaging results that were available during my care of the patient were reviewed by me and considered in my medical decision making (see chart for details).    Consult: Reviewed with Dr. Beverely Risen, he advised patient needed the 6.0 tracheostomy cannula. Advised patient had mature ostomy and should tolerate some additional pressure and positioning to reinsert the 6.0.  Consult: Notify Dr. Valetta Mole of plan to replace in the ED. He advises was available to assist if needed.  Final Clinical Impressions(s) / ED Diagnoses   Final diagnoses:  Complication of tracheostomy tube [facility])   Patient arrived with a tracheostomy tube that had been dislodged. No other immediate complaints regarding his underlying medical conditions.Patient is undergoing palliative radiation and immunotherapy. EMS had initially placed a 5.0 endotracheal tube. I did replace that with a 4 cannula trach which passed easily. After consultation with Dr. Simonne Maffucci, recommendation was to reinsert the 6 which upon second attempt was placed without difficulty.  New Prescriptions New Prescriptions   No medications on file     Charlesetta Shanks, MD 03/31/17 1527

## 2017-03-31 NOTE — ED Triage Notes (Signed)
Per EMS. Patient from home. 51 month old tracheostomy (placed AUG 11th) became occluded with mucus plug. Reports productive cough x 1 week. Patient could not breathe so he removed trach and could not get it back in. Airway was replaced by 5.5 ETT by EMS. SpO2 99% on arrival. Hx of throat cancer with last radiation tx on 03/28/17.

## 2017-03-31 NOTE — Discharge Instructions (Signed)
1. Follow-up with your ENT specialist at [facility] this week. .2. Return to the emergency department if you have problems or concerns.

## 2017-06-04 ENCOUNTER — Emergency Department (HOSPITAL_COMMUNITY)
Admission: EM | Admit: 2017-06-04 | Discharge: 2017-06-04 | Disposition: A | Discharge status: Home / Self Care | Payer: 59 | Attending: Emergency Medicine | Admitting: Emergency Medicine

## 2017-06-04 ENCOUNTER — Other Ambulatory Visit: Payer: Self-pay

## 2017-06-04 ENCOUNTER — Emergency Department (HOSPITAL_COMMUNITY): Payer: 59

## 2017-06-04 ENCOUNTER — Encounter (HOSPITAL_COMMUNITY): Payer: Self-pay

## 2017-06-04 DIAGNOSIS — Z79899 Other long term (current) drug therapy: Secondary | ICD-10-CM | POA: Diagnosis not present

## 2017-06-04 DIAGNOSIS — Z87891 Personal history of nicotine dependence: Secondary | ICD-10-CM | POA: Insufficient documentation

## 2017-06-04 DIAGNOSIS — R0603 Acute respiratory distress: Secondary | ICD-10-CM | POA: Diagnosis not present

## 2017-06-04 DIAGNOSIS — R05 Cough: Secondary | ICD-10-CM | POA: Diagnosis present

## 2017-06-04 LAB — I-STAT CHEM 8, ED
BUN: 12 mg/dL (ref 6–20)
CALCIUM ION: 1.22 mmol/L (ref 1.15–1.40)
CHLORIDE: 99 mmol/L — AB (ref 101–111)
Creatinine, Ser: 0.7 mg/dL (ref 0.61–1.24)
Glucose, Bld: 116 mg/dL — ABNORMAL HIGH (ref 65–99)
HCT: 35 % — ABNORMAL LOW (ref 39.0–52.0)
Hemoglobin: 11.9 g/dL — ABNORMAL LOW (ref 13.0–17.0)
Potassium: 3.8 mmol/L (ref 3.5–5.1)
SODIUM: 137 mmol/L (ref 135–145)
TCO2: 30 mmol/L (ref 22–32)

## 2017-06-04 LAB — CBC WITH DIFFERENTIAL/PLATELET
BASOS ABS: 0 10*3/uL (ref 0.0–0.1)
BASOS PCT: 0 %
EOS ABS: 0 10*3/uL (ref 0.0–0.7)
EOS PCT: 0 %
HCT: 35.6 % — ABNORMAL LOW (ref 39.0–52.0)
Hemoglobin: 11.3 g/dL — ABNORMAL LOW (ref 13.0–17.0)
Lymphocytes Relative: 7 %
Lymphs Abs: 0.8 10*3/uL (ref 0.7–4.0)
MCH: 28.7 pg (ref 26.0–34.0)
MCHC: 31.7 g/dL (ref 30.0–36.0)
MCV: 90.4 fL (ref 78.0–100.0)
Monocytes Absolute: 0.4 10*3/uL (ref 0.1–1.0)
Monocytes Relative: 4 %
Neutro Abs: 9.5 10*3/uL — ABNORMAL HIGH (ref 1.7–7.7)
Neutrophils Relative %: 89 %
PLATELETS: 315 10*3/uL (ref 150–400)
RBC: 3.94 MIL/uL — AB (ref 4.22–5.81)
RDW: 14.3 % (ref 11.5–15.5)
WBC: 10.8 10*3/uL — AB (ref 4.0–10.5)

## 2017-06-04 MED ORDER — LEVOTHYROXINE SODIUM 88 MCG PO TABS
88.0000 ug | ORAL_TABLET | Freq: Every day | ORAL | Status: DC
  Filled 2017-06-04: qty 1

## 2017-06-04 MED ORDER — HYDROMORPHONE HCL 2 MG PO TABS
4.0000 mg | ORAL_TABLET | ORAL | Status: DC | PRN
  Administered 2017-06-04: 4 mg
  Filled 2017-06-04: qty 2

## 2017-06-04 MED ORDER — GABAPENTIN 800 MG PO TABS: 800.0000 mg | ORAL_TABLET | Freq: Three times a day (TID) | ORAL | Status: DC

## 2017-06-04 MED ORDER — HYDROMORPHONE HCL 1 MG/ML IJ SOLN
1.0000 mg | Freq: Once | INTRAMUSCULAR | Status: AC
  Administered 2017-06-04: 1 mg via INTRAVENOUS
  Filled 2017-06-04: qty 1

## 2017-06-04 MED ORDER — LORAZEPAM 2 MG/ML IJ SOLN
1.0000 mg | Freq: Once | INTRAMUSCULAR | Status: AC
  Administered 2017-06-04: 1 mg via INTRAVENOUS
  Filled 2017-06-04: qty 1

## 2017-06-04 MED ORDER — GABAPENTIN 250 MG/5ML PO SOLN
800.0000 mg | Freq: Three times a day (TID) | ORAL | Status: DC
  Administered 2017-06-04: 800 mg
  Filled 2017-06-04 (×3): qty 16

## 2017-06-04 MED ORDER — IOPAMIDOL (ISOVUE-370) INJECTION 76%
INTRAVENOUS | Status: AC
  Filled 2017-06-04: qty 100

## 2017-06-04 MED ORDER — IOPAMIDOL (ISOVUE-370) INJECTION 76%
100.0000 mL | Freq: Once | INTRAVENOUS | Status: AC | PRN
  Administered 2017-06-04: 100 mL via INTRAVENOUS

## 2017-06-04 NOTE — ED Triage Notes (Signed)
Pt is a stage 4 lung cancer patient. He is being treated at [facility]. His last immunotherapy treatment was Thursday. He is complaining about increased SOB since about 4 am. Pt has a trach. Also reporting increased bloody secretions being coughed up. Ambulatory

## 2017-06-04 NOTE — Progress Notes (Signed)
Trach exchanged per pt request. 6 cuffless replaced with 6 cuffless. Sats 98% confirmed with end tidal co2 monitoring.

## 2017-06-04 NOTE — ED Provider Notes (Signed)
[facility] DEPT Provider Note   CSN: [id number] Arrival date & time: 06/04/17  1142     History   Chief Complaint Chief Complaint  Patient presents with  . Shortness of Breath    Stage 4 Lung Cancer    HPI Michael Dunn is a 46 y.o. male.  He presents for evaluation of worsening cough, sputum production, and bleeding, and sputum, with fear of "choking."  He has been aggressively tracheal suctioning, through his tracheostomy tube, without relief.  Sometimes he has profuse bleeding, with coughing.  He was evaluated by his oncology service, several days ago, at [facility], in [place], [place].  At that time he was prescribed Remeron, and Klonopin, to help his anxious feelings and treat for possible depression.  He was scheduled for outpatient CT/PET scan of the chest, to be done on 06/14/17.  Patient does not use oxygen at home.  His oxygen saturations have been "high, 100."  There is been no nausea, vomiting, fever or chills.  The patient tends to get more anxious when people are not around him.  There are no other known modifying factors.   HPI  Past Medical History:  Diagnosis Date  . Anxiety   . Bipolar disorder ([place])   . Cancer ([place]) 07/04/2008   squamous cell ca of nasal pharynx  . History of cancer chemotherapy 2010  . History of radiation therapy 08/21/08- 10/10/08   nasopharynx, draining lymph nodes  . 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 Tonsil cancer ([place]) 09/04/12   L tonsil- squamous cell    Patient Active Problem List   Diagnosis Date Noted  . History of radiation therapy   . Tonsil cancer ([place])   . 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 Cancer ([place]) 07/04/2008    Past Surgical History:  Procedure Laterality Date  . KNEE SURGERY  2008  . resection of spermatocele  11/15/07  . TONSILLECTOMY Left 10/01/12   robotic -resection on 10/19/12, left tonsil, soft  palate       Home Medications    Prior to Admission medications   Medication Sig Start Date End Date Taking? Authorizing Provider  acetaminophen (TYLENOL) 160 MG/5ML solution Place 649.6 mg into feeding tube every 6 (six) hours.   Yes [provider]  cholecalciferol (VITAMIN D) 1000 units tablet Take 1,000 Units by mouth daily.   Yes [provider]  fentaNYL (DURAGESIC - DOSED MCG/HR) 100 MCG/HR Place 100 mcg onto the skin every 3 (three) days.   Yes [provider]  gabapentin (NEURONTIN) 800 MG tablet Take 800 mg by mouth 3 (three) times daily.   Yes [provider]  HYDROmorphone (DILAUDID) 8 MG tablet Take 4-8 mg by mouth every 4 (four) hours as needed for severe pain.   Yes [provider]  ibuprofen (ADVIL,MOTRIN) 200 MG tablet Take 800 mg by mouth every 6 (six) hours as needed for fever, headache, mild pain, moderate pain or cramping.   Yes [provider]  levothyroxine (SYNTHROID, LEVOTHROID) 100 MCG tablet Take 88 mcg by mouth daily before breakfast.   Yes [provider]  nivolumab (OPDIVO) 240 MG/24ML SOLN chemo injection Inject 240 mg into the vein every 14 (fourteen) days. *Given at [facility]*   Yes [provider]  omeprazole (PRILOSEC) 20 MG capsule Take 40 mg by mouth daily.   Yes [provider]  ROBITUSSIN 12 HOUR  COUGH 30 MG/5ML liquid 20 mLs every 4 (four) hours by Feeding Tube route. 05/04/17  Yes [provider]  sennosides-docusate sodium (SENOKOT-S) 8.6-50 MG tablet Place 2 tablets into feeding tube 2 (two) times daily.   Yes [provider]    Family History Family History  Adopted: Yes  Problem Relation Age of Onset  . Diabetes Father   . Microcephaly Sister   . Microcephaly Brother     Social History Social History   Tobacco Use  . Smoking status: Former Smoker    Packs/day: 1.00    Years: 25.00    Pack years: 25.00    Types: Cigarettes    Last  attempt to quit: 07/18/2010    Years since quitting: 6.8  Substance Use Topics  . Alcohol use: No  . Drug use: No     Allergies   Patient has no known allergies.   Review of Systems Review of Systems  All other systems reviewed and are negative.    Physical Exam Updated Vital Signs BP (!) 178/107 (BP Location: Right Arm)   Pulse 82   Temp 98.9 F (37.2 C) (Oral)   Resp 20   SpO2 99%   Physical Exam  Constitutional: He is oriented to person, place, and time. He appears well-developed. He is not intubated.  Chronically ill-appearing, under nourished  HENT:  Head: Normocephalic and atraumatic.  Right Ear: External ear normal.  Left Ear: External ear normal.  Eyes: Conjunctivae and EOM are normal. Pupils are equal, round, and reactive to light.  Neck: Normal range of motion and phonation normal. Neck supple.  No stridor.  Tracheostomy present, with purulent sputum, and blood.  Cardiovascular: Normal rate, regular rhythm and normal heart sounds.  Pulmonary/Chest: Effort normal. No accessory muscle usage. Tachypnea noted. He is not intubated. He has decreased breath sounds (Bilateral scattered rhonchi.  No audible wheezing.). He exhibits no bony tenderness.  Tracheostomy tube lower neck, patent, with occasional sputum production, with bleeding noted during examination.  He is tachypneic.  Rhonchi present.  Abdominal: Soft. There is no tenderness.  Musculoskeletal: Normal range of motion.  Neurological: He is alert and oriented to person, place, and time. No cranial nerve deficit or sensory deficit. He exhibits normal muscle tone. Coordination normal.  Skin: Skin is warm, dry and intact.  Psychiatric: He has a normal mood and affect. His behavior is normal. Judgment and thought content normal.  Nursing note and vitals reviewed.    ED Treatments / Results  Labs (all labs ordered are listed, but only abnormal results are displayed) Labs Reviewed  CBC WITH  DIFFERENTIAL/PLATELET - Abnormal; Notable for the following components:      Result Value   WBC 10.8 (*)    RBC 3.94 (*)    Hemoglobin 11.3 (*)    HCT 35.6 (*)    Neutro Abs 9.5 (*)    All other components within normal limits  I-STAT CHEM 8, ED - Abnormal; Notable for the following components:   Chloride 99 (*)    Glucose, Bld 116 (*)    Hemoglobin 11.9 (*)    HCT 35.0 (*)    All other components within normal limits    EKG  EKG Interpretation  Date/Time:  Sunday June 04 2017 12:05:32 EST Ventricular Rate:  71 PR Interval:    QRS Duration: 105 QT Interval:  388 QTC Calculation: 422 R Axis:   51 Text Interpretation:  Sinus rhythm No old tracing to compare Confirmed by Daleen Bo (  [id number]) on 06/04/2017 12:51:59 PM       Radiology Dg Chest 2 View  Result Date: 06/04/2017 CLINICAL DATA:  Stage IV lung cancer.  Increase shortness of breath. EXAM: CHEST  2 VIEW COMPARISON:  03/31/2017 FINDINGS: Tracheostomy tube remains in stable position. Heart and mediastinal contours are within normal limits. No focal opacities or effusions. No acute bony abnormality. IMPRESSION: No active cardiopulmonary disease. Electronically Signed   By: Rolm Baptise M.D.   On: 06/04/2017 12:18   Ct Angio Chest Pe W/cm &/or Wo Cm  Result Date: 06/04/2017 CLINICAL DATA:  Patient has stage IV lung cancer by report. Last immunotherapy 3 days ago. Increasing shortness of breath. Increasing bloody secretions. EXAM: CT ANGIOGRAPHY CHEST WITH CONTRAST TECHNIQUE: Multidetector CT imaging of the chest was performed using the standard protocol during bolus administration of intravenous contrast. Multiplanar CT image reconstructions and MIPs were obtained to evaluate the vascular anatomy. CONTRAST:  162mL ISOVUE-370 IOPAMIDOL (ISOVUE-370) INJECTION 76% COMPARISON:  Chest x-ray from earlier today and chest CT June 05, 2013 FINDINGS: Cardiovascular: The heart size is normal. No coronary artery calcifications  identified. The thoracic aorta is normal in caliber with no aneurysm or dissection. No atherosclerotic change. No pulmonary emboli. Mediastinum/Nodes: There is soft tissue fullness in the right supraclavicular region on series 5, image 1, incompletely evaluated. There is increased thickening of the trachea and surrounding tissues on series 5, images 15 through 22. While some of the increased density within the trachea could be fluid, the findings are concerning for tumor in this region given history. There is a mildly prominent AP window node on series 5, image 27 measuring 14 mm. No other adenopathy in the chest. Lungs/Pleura: The patient has a tracheostomy tube. There is narrowing of the airway along the distal aspect of the tracheostomy tube and just distal to the tracheostomy tube. The airway measures 5 x 7 mm in transverse an AP diameters immediately inferior to the tracheostomy tube. The airway returns to normal caliber just above the carina. There is debris in the right bronchus intermedius, right lower lobe bronchus, and right middle lobe bronchus. Left-sided airways are normal. No pneumothorax. There is nodularity in the right apex best seen on coronal image 60 measuring 3.1 by 1.2 cm. Scattered ground-glass infiltrate in the right upper lobe such as on image 25 and in the right lower lobe such as on axial image 49. These opacities demonstrate a nodular and tree-in-bud configuration. Similar findings are seen in superior segment of the left lower lobe on series 12, image 35 with ground-glass and nodular opacities, although less prominent than on the right. The left upper lobe is spared. Upper Abdomen: There is a low-density right adrenal nodule which is stable since 2014. By report, this was not FDG avid on previous PET imaging and is likely an adenoma, not significantly changed in for years. No other abnormalities in the upper abdomen. Musculoskeletal: No definitive bony metastatic disease. Review of the  MIP images confirms the above findings. IMPRESSION: 1. Significant narrowing of the trachea at the inferior tip of the tracheostomy tube and just inferior to the tracheostomy tube. The trachea narrows to 5 x 7 mm as above. While there may be some debris within the trachea, the findings are suspicious for tumor given history. Recommend comparison to outside imaging. 2. Nodularity in the anterior aspect of the right apex may represent tumor given history. Recommend correlation with outside imaging. 3. Soft tissue fullness in the right supraclavicular region poorly evaluated on  this study. Mildly prominent AP window node. 4. Scattered ground-glass, nodular, and tree-in-bud opacities in the lungs, right greater than left could be due to aspiration or infectious/inflammatory causes. Findings called to Dr. Eulis Foster. Electronically Signed   By: Dorise Bullion III M.D   On: 06/04/2017 16:36    Procedures .Critical Care Performed by: Daleen Bo, MD Authorized by: Daleen Bo, MD   Critical care provider statement:    Critical care time (minutes):  45   Critical care start time:  06/04/2017 1:15 PM   Critical care end time:  06/04/2017 5:30 PM   Critical care time was exclusive of:  Separately billable procedures and treating other patients   Critical care was necessary to treat or prevent imminent or life-threatening deterioration of the following conditions:  Respiratory failure   Critical care was time spent personally by me on the following activities:  Blood draw for specimens, discussions with consultants, evaluation of patient's response to treatment, examination of patient, obtaining history from patient or surrogate, ordering and performing treatments and interventions, ordering and review of laboratory studies, ordering and review of radiographic studies, pulse oximetry, re-evaluation of patient's condition and review of old charts   I assumed direction of critical care for this patient from  another provider in my specialty: no     (including critical care time)  Medications Ordered in ED Medications  iopamidol (ISOVUE-370) 76 % injection (not administered)  gabapentin (NEURONTIN) 250 MG/5ML solution 800 mg (not administered)  HYDROmorphone (DILAUDID) tablet 4 mg (not administered)  levothyroxine (SYNTHROID, LEVOTHROID) tablet 88 mcg (not administered)  HYDROmorphone (DILAUDID) injection 1 mg (not administered)  LORazepam (ATIVAN) injection 1 mg (not administered)  iopamidol (ISOVUE-370) 76 % injection 100 mL (100 mLs Intravenous Contrast Given 06/04/17 1416)  LORazepam (ATIVAN) injection 1 mg (1 mg Intravenous Given 06/04/17 1518)     Initial Impression / Assessment and Plan / ED Course  I have reviewed the triage vital signs and the nursing notes.  Pertinent labs & imaging results that were available during my care of the patient were reviewed by me and considered in my medical decision making (see chart for details).  Clinical Course as of Jun 04 1824  Nancy Fetter Jun 04, 2017  1819 I discussed the findings with the radiologist who interpreted the images.  Major clinical abnormality is airway narrowing, to 5 x 7 mm, below his tracheostomy.  This is most likely related to head and neck cancer.  There are also airway abnormalities that are nonspecific, and most likely related to blood, that is tracking into the airway, from the trachea. CT Angio Chest PE W/Cm &/Or Wo Cm [EW]  1820 Mild elevation WBC: (!) 10.8 [EW]  1820 Elevated Pulse Rate: (!) 116 [EW]  1820 Elevated Resp: (!) 22 [EW]  1820 Normal SpO2: 99 % [EW]    Clinical Course User Index [EW] Daleen Bo, MD     Patient Vitals for the past 24 hrs:  BP Temp Temp src Pulse Resp SpO2  06/04/17 1819 (!) 178/107 - - 82 20 99 %  06/04/17 1525 (!) 188/103 - - (!) 116 (!) 22 100 %  06/04/17 1300 - - - (!) 112 (!) 22 99 %  06/04/17 1146 (!) 191/107 98.9 F (37.2 C) Oral 91 (!) 22 97 %    17:30-I discussed the case  with Dr. Irene Shipper, [facility], oncology, who accepts the patient in transfer to a medical floor for further evaluation and treatment.  We discussed the need for multispecialty  consultation, and an urgent treatment.  The patient is not in acute respiratory distress and likely will not need either radiation or aggressive airway management this evening.   17:45 PM Reevaluation with update and discussion. After initial assessment and treatment, an updated evaluation reveals patient remains clinically unchanged.  He agrees for transfer, to [facility].  Wife with patient and understands plan, questions answered. Daleen Bo     Final Clinical Impressions(s) / ED Diagnoses   Final diagnoses:  Respiratory distress   Respiratory distress with feeling of suffocation, and narrowed airway, likely related to progressive head and neck cancer.  Patient has been aggressively suctioning his trachea, which is likely leading to bleeding in the respiratory tract.  Doubt acute impending respiratory failure.  Patient requires multispecialty consultation, evaluation and treatment therefore will need to be transferred, to a [facility], where he usually receives his care.  He has been accepted in transfer and a bed is pending.  Nursing Notes Reviewed/ Care Coordinated Applicable Imaging Reviewed Interpretation of Laboratory Data incorporated into ED treatment  Plan-transfer to [facility] as soon as a bed opens.  Symptomatic treatment to proceed in the emergency department, until transfer.    ED Discharge Orders    None       Daleen Bo, MD 06/04/17 [phone]

## 2017-06-04 NOTE — ED Notes (Addendum)
Patient transported via [place]. Oscar,RN verified patient's EMTALA documentation with this nurse.

## 2017-06-04 NOTE — ED Notes (Signed)
He was unable to make it through his CT d/t anxiety and coughing up bloody sputum. Med given and will attempt CT again shortly.

## 2017-07-01 ENCOUNTER — Encounter (HOSPITAL_COMMUNITY): Payer: Self-pay

## 2017-07-01 DIAGNOSIS — C119 Malignant neoplasm of nasopharynx, unspecified: Secondary | ICD-10-CM | POA: Insufficient documentation

## 2017-07-01 DIAGNOSIS — Z87891 Personal history of nicotine dependence: Secondary | ICD-10-CM | POA: Diagnosis not present

## 2017-07-01 DIAGNOSIS — Z431 Encounter for attention to gastrostomy: Secondary | ICD-10-CM | POA: Diagnosis not present

## 2017-07-01 DIAGNOSIS — Z79899 Other long term (current) drug therapy: Secondary | ICD-10-CM | POA: Diagnosis not present

## 2017-07-01 NOTE — ED Triage Notes (Signed)
Pt has stage 4 mouth cancer and has had his feeding tube since August, tonight he felt it leak ans then it came out

## 2017-07-02 ENCOUNTER — Other Ambulatory Visit: Payer: Self-pay

## 2017-07-02 ENCOUNTER — Emergency Department (HOSPITAL_COMMUNITY)
Admission: EM | Admit: 2017-07-02 | Discharge: 2017-07-02 | Disposition: A | Discharge status: Home / Self Care | Payer: 59 | Attending: Emergency Medicine | Admitting: Emergency Medicine

## 2017-07-02 DIAGNOSIS — Z431 Encounter for attention to gastrostomy: Secondary | ICD-10-CM

## 2017-07-02 DIAGNOSIS — T85528A Displacement of other gastrointestinal prosthetic devices, implants and grafts, initial encounter: Secondary | ICD-10-CM

## 2017-07-02 NOTE — ED Notes (Signed)
ED Provider at bedside. 

## 2017-07-02 NOTE — Discharge Instructions (Signed)
Follow-up with your doctors or return here for any additional problems or concerns.

## 2017-07-02 NOTE — ED Provider Notes (Addendum)
[facility] DEPT Provider Note   CSN: [id number] Arrival date & time: 07/01/17  2307     History   Chief Complaint Chief Complaint  Patient presents with  . feeding tube out    HPI Michael Dunn is a 46 y.o. male.  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  Patient receives majority of his care at [facility].  He has no other complaints.   The history is provided by the patient.    Past Medical History:  Diagnosis Date  . Anxiety   . Bipolar disorder ([place])   . Cancer ([place]) 07/04/2008   squamous cell ca of nasal pharynx  . History of cancer chemotherapy 2010  . History of radiation therapy 08/21/08- 10/10/08   nasopharynx, draining lymph nodes  . 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 Tonsil cancer ([place]) 09/04/12   L tonsil- squamous cell    Patient Active Problem List   Diagnosis Date Noted  . History of radiation therapy   . Tonsil cancer ([place])   . 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 Cancer ([place]) 07/04/2008    Past Surgical History:  Procedure Laterality Date  . KNEE SURGERY  2008  . resection of spermatocele  11/15/07  . TONSILLECTOMY Left 10/01/12   robotic -resection on 10/19/12, left tonsil, soft palate       Home Medications    Prior to Admission medications   Medication Sig Start Date End Date Taking? Authorizing Provider  acetaminophen (TYLENOL) 160 MG/5ML solution Place 649.6 mg into feeding tube every 6 (six) hours.    [provider]  cholecalciferol (VITAMIN D) 1000 units tablet Take 1,000 Units by mouth daily.    [provider]  fentaNYL (DURAGESIC - DOSED MCG/HR) 100 MCG/HR Place 100 mcg onto the skin every 3 (three) days.    [provider]  gabapentin (NEURONTIN) 800 MG tablet Take 800 mg by mouth 3  (three) times daily.    [provider]  HYDROmorphone (DILAUDID) 8 MG tablet Take 4-8 mg by mouth every 4 (four) hours as needed for severe pain.    [provider]  ibuprofen (ADVIL,MOTRIN) 200 MG tablet Take 800 mg by mouth every 6 (six) hours as needed for fever, headache, mild pain, moderate pain or cramping.    [provider]  levothyroxine (SYNTHROID, LEVOTHROID) 100 MCG tablet Take 88 mcg by mouth daily before breakfast.    [provider]  nivolumab (OPDIVO) 240 MG/24ML SOLN chemo injection Inject 240 mg into the vein every 14 (fourteen) days. *Given at [facility]*    [provider]  omeprazole (PRILOSEC) 20 MG capsule Take 40 mg by mouth daily.    [provider]  ROBITUSSIN 12 HOUR COUGH 30 MG/5ML liquid 20 mLs every 4 (four) hours by Feeding Tube route. 05/04/17   [provider]  sennosides-docusate sodium (SENOKOT-S) 8.6-50 MG tablet Place 2 tablets into feeding tube 2 (two) times daily.    [provider]    Family History Family History  Adopted: Yes  Problem Relation Age of Onset  . Diabetes Father   . Microcephaly Sister   . Microcephaly Brother     Social History Social History   Tobacco Use  . Smoking status: Former Smoker    Packs/day: 1.00  Years: 25.00    Pack years: 25.00    Types: Cigarettes    Last attempt to quit: 07/18/2010    Years since quitting: 6.9  . Smokeless tobacco: Never Used  Substance Use Topics  . Alcohol use: No  . Drug use: No     Allergies   Patient has no known allergies.   Review of Systems Review of Systems  All other systems reviewed and are negative.    Physical Exam Updated Vital Signs BP 113/80 (BP Location: Right Arm)   Pulse 84   Temp 98.2 F (36.8 C) (Oral)   Resp 18   Ht 5\' 10"  (1.778 m)   Wt 68.5 kg (151 lb)   SpO2 98%   BMI 21.67 kg/m   Physical Exam  Constitutional: No distress.  Patient is a chronically ill-appearing male.   Neck: Normal range of motion. Neck supple.  Abdominal: Soft. He exhibits no distension. There is no tenderness.  Feeding tube insertion site appears clean.  There is no significant surrounding erythema.  Skin: He is not diaphoretic.  Nursing note and vitals reviewed.    ED Treatments / Results  Labs (all labs ordered are listed, but only abnormal results are displayed) Labs Reviewed - No data to display  EKG  EKG Interpretation None       Radiology No results found.  Procedures Procedures (including critical care time)  Medications Ordered in ED Medications - No data to display   Initial Impression / Assessment and Plan / ED Course  I have reviewed the triage vital signs and the nursing notes.  Pertinent labs & imaging results that were available during my care of the patient were reviewed by me and considered in my medical decision making (see chart for details).  The feeding tube was easily replaced.  Tube placement was confirmed with irrigation and aspiration.  Final Clinical Impressions(s) / ED Diagnoses   Final diagnoses:  None    ED Discharge Orders    None       Veryl Speak, MD 07/02/17 Areta Haber    Veryl Speak, MD 07/25/17 2300

## 2017-07-02 NOTE — ED Notes (Signed)
Dr.Delo replaced G-tube with 20fr G-TUBE

## 2017-07-18 DEATH — deceased

## 2018-09-04 IMAGING — CR DG CHEST 2V
2 series · 2 of 2 positions shown · non-contrast
Comparison: CT chest dated June 05, 2013.

CLINICAL DATA: Cough.

EXAM:
CHEST  2 VIEW

[x chest ap]
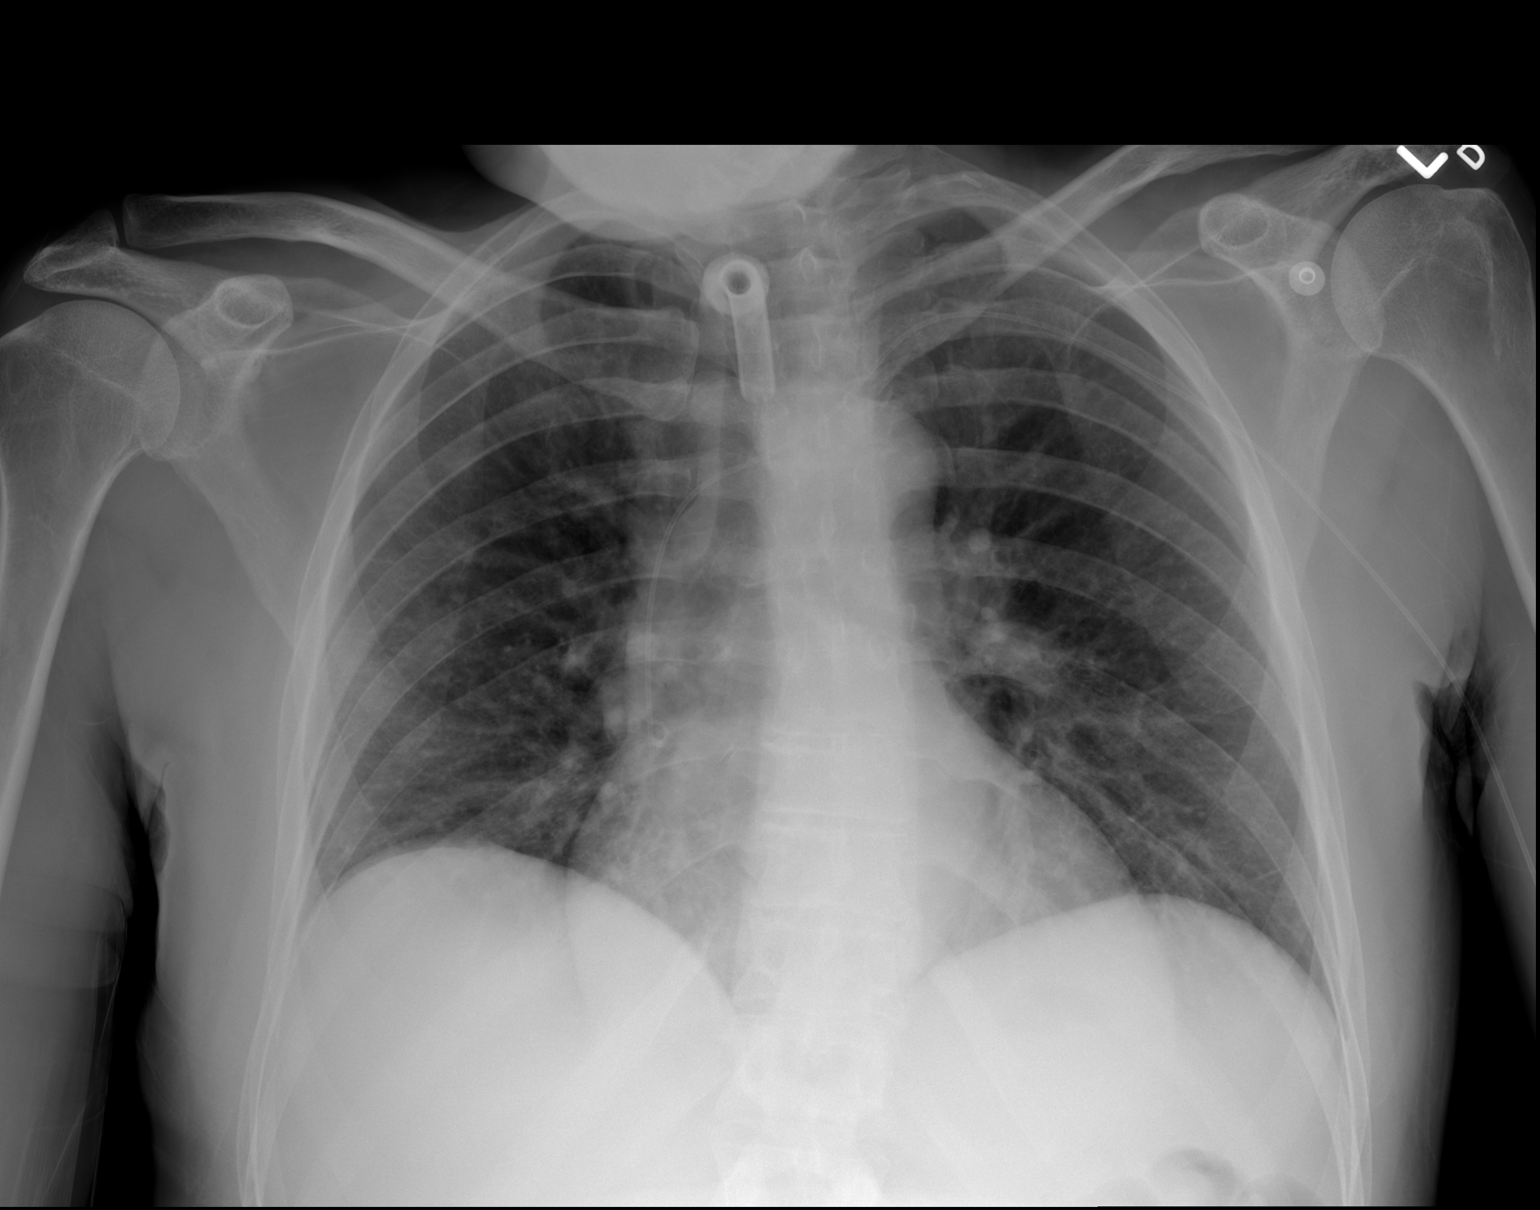

[w chest lat]
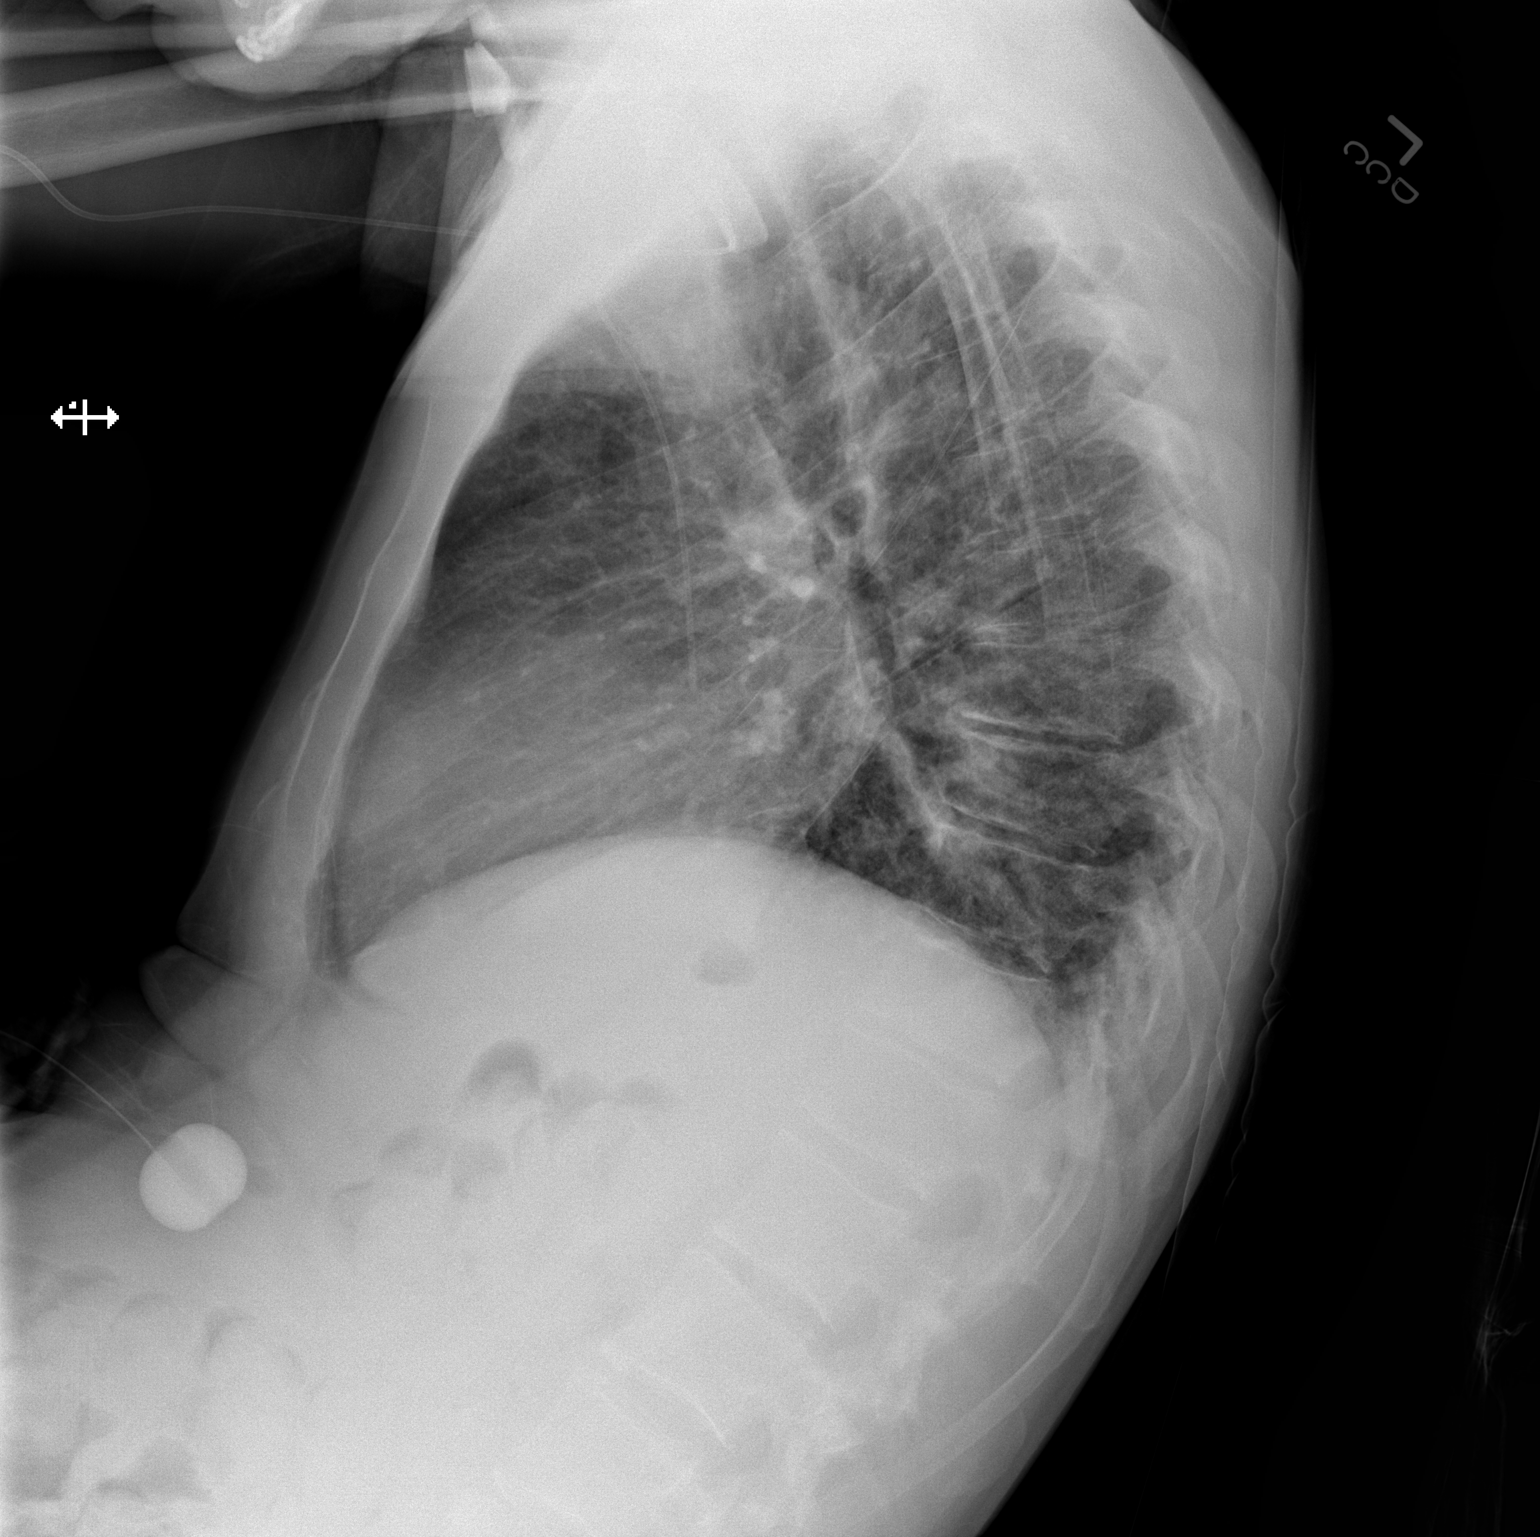

[2 of 2 positions shown; findings below may reference images not displayed]

FINDINGS: Tracheostomy tube in good position with the tip at thoracic inlet.
Left sided PICC line with the tip at the cavoatrial junction. The
cardiomediastinal silhouette is normal in size. Normal pulmonary
vascularity. Bibasilar atelectasis. No focal consolidation, pleural
effusion, or pneumothorax. No acute osseous abnormality.
IMPRESSION: No active cardiopulmonary disease.
